# Patient Record
Sex: Female | Born: 1955 | Race: Black or African American | Smoking: Never smoker
Health system: Northeastern US, Academic
[De-identification: ages and names within clinical notes are randomized; demographics above are authoritative.]

## PROBLEM LIST (undated history)

## (undated) DIAGNOSIS — B1910 Unspecified viral hepatitis B without hepatic coma: Secondary | ICD-10-CM

## (undated) DIAGNOSIS — E785 Hyperlipidemia, unspecified: Secondary | ICD-10-CM

## (undated) HISTORY — DX: Unspecified viral hepatitis B without hepatic coma: B19.10

## (undated) HISTORY — DX: Hyperlipidemia, unspecified: E78.5

---

## 1978-01-16 DIAGNOSIS — K589 Irritable bowel syndrome without diarrhea: Secondary | ICD-10-CM | POA: Insufficient documentation

## 2005-08-04 DIAGNOSIS — F3289 Other specified depressive episodes: Secondary | ICD-10-CM | POA: Insufficient documentation

## 2005-08-04 DIAGNOSIS — B181 Chronic viral hepatitis B without delta-agent: Secondary | ICD-10-CM | POA: Insufficient documentation

## 2005-08-04 DIAGNOSIS — N943 Premenstrual tension syndrome: Secondary | ICD-10-CM | POA: Insufficient documentation

## 2005-08-04 DIAGNOSIS — N760 Acute vaginitis: Secondary | ICD-10-CM | POA: Insufficient documentation

## 2006-05-07 DIAGNOSIS — D259 Leiomyoma of uterus, unspecified: Secondary | ICD-10-CM | POA: Insufficient documentation

## 2010-04-17 ENCOUNTER — Encounter: Payer: Self-pay | Admitting: Gastroenterology

## 2011-03-28 ENCOUNTER — Other Ambulatory Visit: Payer: Self-pay | Admitting: Physical Medicine and Rehabilitation

## 2011-03-29 ENCOUNTER — Ambulatory Visit: Payer: Self-pay | Admitting: Physical Medicine and Rehabilitation

## 2011-03-29 ENCOUNTER — Encounter: Payer: Self-pay | Admitting: Physical Medicine and Rehabilitation

## 2011-03-29 VITALS — BP 138/65 | Ht 65.0 in | Wt 195.0 lb

## 2011-03-29 MED ORDER — MELOXICAM 15 MG PO TABS *I*
15.0000 mg | ORAL_TABLET | Freq: Every day | ORAL | Status: DC
Start: 2011-03-29 — End: 2016-07-17

## 2011-03-29 NOTE — Progress Notes (Signed)
Dear Dr. Jana Hakim, Pamela Senior, MD,PHD,     Pamela Mccoy was seen on 03/29/2011 at [facility] today to address the complaint of neck pain.        History of present illness: Pamela Mccoy is a 56 y.o. female with  acute onset of neck pain following a work related injury on February 24, 2011.  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  Patient's pain has improved.  She denies any radiation of pain into either upper extremity.  Pain is worse when she tries to move her neck.  Denies weakness or numbness.  Patient continues to work her regular job..     Past medical history, past surgical history, medications, allergies, social history, family history, and review of systems are per the questionnaire this date reviewed and annotated with the patient and scanned into the chart for reference.       Allergies:  Food    Current medications:   As per E. record    Past medical history:    Past Medical History   Diagnosis Date    Chronic pulmonary heart disease, unspecified      Conversion Data - ^Resolved    Hepatitis B     Depression         Past surgical history:   Past Surgical History   Procedure Date    Cesarean section, classic      Cesarean Section Conversion Data          Family history:  Family History   Problem Relation Age of Onset    Cancer Mother     Cancer Father          Social History:     The patient does not have a present or past history of suicide ideation or attempt.   Substance Abuse History: patient denied any past history of illicit substance use.   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  Positive for hep B  No fever/chills, CP, SOB and all other ROS negative on a 10 point review.    Physical exam:  Vitals: Blood pressure 138/65, height  1.651 m (5\' 5" ), weight 88.451 kg (195 lb).    General: pleasant  56 y.o., no significant pain behavior, seems comfortable, no distress, able to have conversation   HEENT: mucus moist, oral hygiene   Resp: breathing comfortably on room air   CV: Pulses regular in rate and rhythm  Abd: Soft, NT, ND   Ext's: no edema, no erythema/warmth  Skin: no rash or lesions observed on the skin   Musculoskeletal: No spasms or clonus present during the exam  Muscle strength is  5/5 of bilateral deltoids, biceps, triceps, wrist extensors, finger flexors, hip flexors, knee extensors, ankle dorsiflexors and EHL, and plantar flexors  Myofascial Exam: Tenderness to palpation in bilateral upper trapezius and paracervicals  Reflexes 2+ throughout  Neuro: No allodynia, AO x 3  No sensory deficits   Restriction in cervical range of motion and rotation to the right due to left-sided neck pain  Full shoulder range of motion    Previous imaging/diagnostic studies:   None available    Assessment:  Pamela Mccoy is a 56 y.o. female   with acute onset of neck  pain following a work related injury on February 24, 2011.  Symptoms and exam consistent with cervical myofascial pain.      Plan: Recommend physical therapy focusing on myofascial release.  Patient will be given a prescription for Mobic to take consistently for 2 weeks.  Followup in 4 weeks.        .      Thank you for allowing [place] to participate in the care of this patient. Please do not hesitate to call us with any questions.        Metro Kung, MD 03/29/2011 10:23 AM

## 2011-03-31 ENCOUNTER — Encounter: Payer: Self-pay | Admitting: Gastroenterology

## 2011-04-05 ENCOUNTER — Encounter: Payer: Self-pay | Admitting: Physical Medicine and Rehabilitation

## 2011-04-26 ENCOUNTER — Encounter: Payer: Self-pay | Admitting: Physical Medicine and Rehabilitation

## 2011-04-26 ENCOUNTER — Ambulatory Visit: Payer: Self-pay | Admitting: Physical Medicine and Rehabilitation

## 2011-04-26 VITALS — BP 136/67 | HR 80 | Ht 65.0 in | Wt 195.0 lb

## 2011-04-26 DIAGNOSIS — M542 Cervicalgia: Secondary | ICD-10-CM

## 2011-04-26 NOTE — Progress Notes (Signed)
Ms. Rutten is here today for a followup visit.  Overall she reports 75% improvement in neck pain.  She has been [facility] [place].    On exam there is some nonspecific tenderness to palpation in bilateral upper traps.  Cervical range of motion is functional.  There is no focal motor weakness.    Impression/plan: Cervicalgia status post work injury in February 2013.  Patient is making excellent progress with PT.  She is working regular job without restrictions.  Followup in 4-6 weeks.

## 2011-05-07 ENCOUNTER — Encounter: Payer: Self-pay | Admitting: Gastroenterology

## 2011-05-31 ENCOUNTER — Ambulatory Visit: Payer: Self-pay | Admitting: Physical Medicine and Rehabilitation

## 2011-07-26 ENCOUNTER — Encounter: Payer: Self-pay | Admitting: Sports Medicine

## 2011-07-26 ENCOUNTER — Ambulatory Visit: Payer: Self-pay | Admitting: Sports Medicine

## 2011-07-26 VITALS — BP 122/70 | Ht 65.0 in | Wt 193.0 lb

## 2011-07-26 DIAGNOSIS — M25529 Pain in unspecified elbow: Secondary | ICD-10-CM

## 2011-07-26 NOTE — Progress Notes (Signed)
History: Pamela Mccoy is a 56 y.o. that is being seen as a consult request from Dr. Lavonne Chick, MD,PHD for evaluation of Right elbow pain. The symptoms first occured 4 weeks ago.  The location of the pain is lateral.  Aggravating factors include:movement, lifting.  Alleviating factors include:  rest.     Past medical history, past surgical history, medications, allergies, family history, social history, and review of systems were reviewed today and have been documented separately in this encounter.      Physical Examination:  She is in no acute distress.  She is alert and oriented x 3.     Examination of the right elbow:  The elbow extends to 0.  She flexes to 150.  She  Has full pronation and supination.  She is tender to palpation over the lateral epicondyle.  She is not tender over the medial epicondyle.   She is not tender over the olecranon.  She is not tender at the radial head.  She is not tender over the distal biceps tendon.  Her elbow is stable to varus stress.  Her elbow is stable to valgus stress.  Milking maneuver was not done.  She has grade 4/5 wrist flexion. She has grade 4/5 wrist extension.  Resisted wrist extension causes mild pain.  Resisted wrist flexion cause no pain.  Resisted supination causes no pain.  Resisted pronation causes no pain.  Distally  she is neurovascularly intact.     Imaging: I personally reviewed the patients images. Xray's demonstrate no fracture, dislocation, or loose body.    Assessment:  Right elbow lateral epicondylitis.    Plan: We discussed the diagnosis and treatment.  The patient would like to proceed with a course of physical therapy and NSAIDS.  We will see them back in 6 weeks to evaluate  However we would be happy to see them sooner should symptoms warrant. We will likely do corticosteroid injection at the next visit if her symptoms have not resolved.    Jeb Levering PA performed the duties of a scribe for this encounter in the presence of the documenting  physician. This patient was seen by Dr. Normajean Glasgow who personally examined the patient and determined the plan of care.

## 2011-08-01 ENCOUNTER — Ambulatory Visit: Payer: Self-pay

## 2011-08-01 DIAGNOSIS — M7711 Lateral epicondylitis, right elbow: Secondary | ICD-10-CM

## 2011-08-01 NOTE — Progress Notes (Signed)
[facility]    History  Diagnosis: Right elbow lateral epicondylitis      Onset date:  Approximately 5 weeks ago, Acute  Date of surgery: NA    Subjective    Pamela Mccoy is a 56 y.o. female who is present today for right, elbow pain.  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  Stresses/physical demands of job: Office work with some patient care  Stresses/physical demands of home: Housekeeping, Gardening/Yard Work and hiking, working out at [employer]): None  Diagnostic tests: X-ray   Other: NA    Symptom location: Lateral, right  Relevant symptoms: Aching, Sharp, Pain , Decreased strength  Symptom frequency: Intermittent  Symptom intensity (0 - 10 scale): Now 4 Best 1 Worst 8   Night Pain: no    Restful sleep:   yes  Morning Pain/Stiffness: N/A   Symptoms worsen with: Lifting, Gripping  Symptoms improve with: Rest, Medication  Assistive device:  none  Patient's goals for therapy: Reduce pain, Increase ROM, Increase strength, Independent with home program     Objective:    Observation: WNL  Palpation: tenderness @ joint  Cervical Screen:  Not Tested  Neurologic:  Not Tested    Incision:  NA    ROM/Strength    UE AROM AROM PROM PROM MMT MMT    Right Left Right Left Right Left   Elbow         Flexion 150        Extension 0        Pronation         Supination                     UE AROM AROM PROM PROM MMT MMT    Right Left Right Left Right Left   Wrist         Flexion         Extension           Special Tests:    Shoulder NA   Elbow Milking Maneuver,  negative, Moving Valgus stress test,  negative, Lateral / Medial Epicondylitis,  positive   Wrist/hand NA     Functional:  Perform self-care activities/basic ADLs - able to perform.    Assessment:   Findings consistent with 56 y.o., female with Right Lateral Epicondylitis  with pain, strength  limitations, functional limitations    Prognosis:  Good   Contraindications/Precautions/Limitation:  Per diagnosis    Short Term Goals: (3 week(s)): Decrease pain to 2/10  and Increase strength by 50%  Long Term Goals: (6 week(s)): Pain/Sx 0 - minimal, ROM/ flexibility WNL , Restoration of functional strength, Independent with HEP/education , Functional return to ADLs / activites without limitation     Treatment Plan:   Options / plan reviewed with patient:  yes  Freq 1 times per week for 6 week(s)    Treatment plan inclusive of:   Exercise: AROM, Stretching, Progressive Resistive   Manual Techniques:  N/A   Modalities:  Cryotherapy, Iontophoresis with dexmethoasone sodium phosphate 4mg /ml   Functional: Proprioception/Dynamic stability, Functional rehab, Eccentric exercises    Thank you for the referral of this patient to [facility].    Francene Finders, M.S., ATC, PTA    Exercise Flow Sheet  Wrist Flex Stretch HEP  Wrist Ext Stretch HEP   4-Wrist T-Band Program (Flex, Ext, UD, RD) HEP   Hammer Sup/Pron. HEP   Flex Bar Eccentrics HEP   Eccentric Ext. with DB HEP   Bicep Curls HEP   Wrist ext HEP           Iontophoresis Dex. 4 mg/mL - Hybresis Patch

## 2011-08-02 ENCOUNTER — Telehealth: Payer: Self-pay

## 2011-08-02 DIAGNOSIS — M7710 Lateral epicondylitis, unspecified elbow: Secondary | ICD-10-CM

## 2011-08-02 NOTE — Telephone Encounter (Signed)
Request order signature for [facility].   Please click "Sign" to complete.   We appreciate your prompt attention to this request. Please do not hesitate to contact us with any questions.  [facility]

## 2011-09-05 ENCOUNTER — Ambulatory Visit: Payer: Self-pay | Admitting: Sports Medicine

## 2011-12-07 DIAGNOSIS — M7710 Lateral epicondylitis, unspecified elbow: Secondary | ICD-10-CM

## 2011-12-07 NOTE — Progress Notes (Signed)
[facility]  Discharge Summary      Pamela Mccoy  [id number]  12/07/2011    Diagnosis: Right Elbow Lateral Epicondylitis    SUMMARY OF TREATMENTS:  Received care for 1 rehabilitation visits.    Attendance:  Fair    Compliance:  Fair     The treatment(s) included:  Home program instruction, Therapeutic exercise (ROM/flexibility/strength), Iontophoresis    Treatment Goals:  Not Achieved  Range of Motion/Flexibility:  Unknown  Strength/Motor Performance:  Unknown  Functional Recovery:  Unknown  Pain Control:  Unknown  Home Program:  Achieved  Other:  N/A    REASON FOR DISCHARGE:  Patient did not return for follow-up     Prognosis at time of discharge:  fair  Comments:  Patient was asked to attend PT for exercise progression and 6 treatments of Iontophoresis. Patient came for initial evaluation only and did not return for follow-up as suggested.    Discharge planning:  Discussion regarding the maintenance of appropriate exercise and activity as part of a healthy lifestyle.  Optional utilization of Post-Rehab Conditioning Program [facility]) for transition and/or additional exercise/activity support    Thank you for the referral of this patient to [facility].    Francene Finders, M.S., ATC, PTA

## 2013-01-06 ENCOUNTER — Encounter: Payer: Self-pay | Admitting: Dermatology

## 2013-01-06 ENCOUNTER — Ambulatory Visit: Payer: Self-pay | Admitting: Dermatology

## 2013-01-06 VITALS — Ht 65.0 in | Wt 200.0 lb

## 2013-01-06 MED ORDER — CLOBETASOL PROPIONATE 0.05 % EX SOLN *I*
Freq: Two times a day (BID) | CUTANEOUS | Status: DC
Start: 2013-01-06 — End: 2016-07-17

## 2013-01-06 MED ORDER — KETOCONAZOLE 2 % EX SHAM *I*
MEDICATED_SHAMPOO | CUTANEOUS | Status: DC
Start: 2013-01-06 — End: 2016-07-17

## 2013-01-06 NOTE — Patient Instructions (Addendum)
1. Likely reaction to hair dye, with dermatitis in the scalp   --Clobetasol solution twice daily to the scalp for 1 month   --Ketoconazole to wash hair every other week, then wash with your preferred shampoo   --Consider patch testing       Follow up as needed

## 2013-01-06 NOTE — Progress Notes (Addendum)
Consulting MD: Teofilo Pod, MD    Chief Complaint   Patient presents with   . New Patient Visit         HPI: Patient is a 57 y.o. female who presents for evaluation and treatment of allergic reaction on her head    This occurred about a week ago.  Pamela Mccoy reports that she dyed her hair on Saturday, and then broke out on Tuesday.  She also used a conditioner at the [employer] that was new the same day.  She went into the sauna on that Tuesday morning while on a cruise to [place] (started the cruise on Monday).  Symptoms were facial swelling, and rash on the hairline and scalp.  The rash was itchy, and burned.  She denies any other rash or burning.  She saw a doctor on the cruise, who started her on steroids, benadryl, and cetirizine.  She was also given eye drops to prevent infection.  She is now done with the steroid course (medrol dose pack).  Overall she feels better, just feeling that her scalp is very itchy and burning.    Patient does not regularly wear sunscreen. There is not a family history of melanoma or nonmelanoma skin cancers.        ROS: Patient is otherwise in normal state of health. No other skin concerns.     Allergies:  Allergies   Allergen Reactions   . Honey Anaphylaxis   . Mango Anaphylaxis   . Sunflower Oil Anaphylaxis       Current Medications:  Patient's Medications   New Prescriptions    CLOBETASOL (TEMOVATE) 0.05 % EXTERNAL SOLUTION    Apply topically 2 times daily    KETOCONAZOLE (NIZORAL) 2 % SHAMPOO    Apply topically twice a week   Previous Medications    ASPIRIN    By no specified route as needed    IBUPROFEN (ADVIL,MOTRIN) 200 MG TABLET    Take 200 mg by mouth as needed    LORATADINE (CLARITIN) 10 MG TABLET    Take 10 mg by mouth daily    MELOXICAM (MOBIC) 15 MG TABLET    Take 1 tablet (15 mg total) by mouth daily (with breakfast)   Take with food.   Modified Medications    No medications on file   Discontinued Medications    No medications on file         PMH:   Active  Ambulatory Problems     Diagnosis Date Noted   . Chronic Hepatitis, B Virus 08/04/2005   . Depression 08/04/2005   . Irritable Bowel Syndrome 01/16/1978   . Bacterial Vaginosis 08/04/2005   . Premenstrual Syndromes 08/04/2005   . Leiomyoma Of The Uterus 05/07/2006   . Dermatitis 01/06/2013     Resolved Ambulatory Problems     Diagnosis Date Noted   . No Resolved Ambulatory Problems     Past Medical History   Diagnosis Date   . Chronic pulmonary heart disease, unspecified    . Hepatitis B    . Depression          Past Surgical History   Procedure Laterality Date   . Cesarean section, classic       x 1   . Cesarean section, classic       Cesarean Section Conversion Data          FH:  Family History   Problem Relation Age of Onset   . Cancer Mother    .  Cancer Father          SocH:  History     Social History   . Marital Status: Single     Spouse Name: N/A     Number of Children: N/A   . Years of Education: N/A     Occupational History   . Not on file.     Social History Main Topics   . Smoking status: Never Smoker    . Smokeless tobacco: Not on file   . Alcohol Use: Yes      Comment: 1/week   . Drug Use: No   . Sexual Activity: Not on file     Other Topics Concern   . Not on file     Social History Narrative    ** Merged History Encounter **              PE  Filed Vitals:    01/06/13 1033   Height: 1.651 m (5\' 5" )   Weight: 90.719 kg (200 lb)     General: Awake and alert   All of the following were examined and found to be within normal limits except as noted below:  -Face/Neck/Scalp: There are scaly papules along the hairline and in the scalp, with diffuse scale.  --No facial edema   -BUE/hands:    Barriers to learning: None.    Assessment/Plan:  All diagnoses and treatment plans listed below were discussed thoroughly with the patient.     1. Likely reaction to hair dye (henna--PPD), with dermatitis in the scalp   --Clobetasol solution twice daily to the scalp for 1 month   --Ketoconazole to wash hair every other  week, then wash with your preferred shampoo   --Consider patch testing       Follow up as needed         Pollie Meyer, MD  Family Medicine Resident  01/06/2013 11:54 AM  Page:  [phone]    I saw and evaluated the patient. I have reviewed and edited the resident's/fellow's note and confirm the findings and plan of care as documented above.    Serina Cowper, MD

## 2013-01-14 ENCOUNTER — Telehealth: Payer: Self-pay

## 2013-01-14 NOTE — Telephone Encounter (Signed)
Left patient a message regarding patch testing.

## 2013-03-31 LAB — HEPATITIS C ANTIBODY: Hep C Ab: NEGATIVE

## 2014-05-25 ENCOUNTER — Encounter: Payer: Self-pay | Admitting: Gastroenterology

## 2014-05-25 LAB — HEMOGLOBIN A1C: Hemoglobin A1C: 6 %

## 2014-11-17 ENCOUNTER — Other Ambulatory Visit: Payer: Self-pay | Admitting: Gastroenterology

## 2014-11-17 LAB — HM MAMMOGRAPHY

## 2015-04-27 ENCOUNTER — Encounter: Payer: Self-pay | Admitting: Gastroenterology

## 2015-04-27 LAB — LIPID PANEL
Chol/HDL Ratio: 4.3
Cholesterol: 189 mg/dL
HDL: 44 mg/dL
LDL Calculated: 132 mg/dL — ABNORMAL HIGH
Non HDL Cholesterol: 145 mg/dL
Triglycerides: 65 mg/dL

## 2016-02-23 ENCOUNTER — Telehealth: Payer: Self-pay | Admitting: Primary Care

## 2016-02-23 NOTE — Telephone Encounter (Signed)
Tdap was done on 06/04/14, she is up to date.

## 2016-02-23 NOTE — Telephone Encounter (Signed)
Pt given message below

## 2016-02-23 NOTE — Telephone Encounter (Signed)
Patient calling as she just cut her hand on a metal strip and is wondering when she had her last tetanus, thinks it was with her physical last year.  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

## 2016-02-23 NOTE — Telephone Encounter (Signed)
Attempted to call pt. No answer, mailbox full, unable to leave a message . Will retry to call later.

## 2016-03-06 ENCOUNTER — Encounter: Payer: Self-pay | Admitting: Primary Care

## 2016-07-17 ENCOUNTER — Encounter: Payer: Self-pay | Admitting: Primary Care

## 2016-07-17 ENCOUNTER — Ambulatory Visit: Payer: PRIVATE HEALTH INSURANCE | Attending: Primary Care | Admitting: Primary Care

## 2016-07-17 ENCOUNTER — Encounter: Payer: Self-pay | Admitting: Gastroenterology

## 2016-07-17 VITALS — BP 120/64 | HR 70 | Wt 212.0 lb

## 2016-07-17 DIAGNOSIS — Z0000 Encounter for general adult medical examination without abnormal findings: Secondary | ICD-10-CM

## 2016-07-17 DIAGNOSIS — Z1211 Encounter for screening for malignant neoplasm of colon: Secondary | ICD-10-CM

## 2016-07-17 DIAGNOSIS — B181 Chronic viral hepatitis B without delta-agent: Secondary | ICD-10-CM

## 2016-07-17 LAB — PCMH DEPRESSION ASSESSMENT

## 2016-07-17 NOTE — Progress Notes (Signed)
Subjective:      Pamela Mccoy is a 61 y.o. female and is here for a comprehensive physical exam. The patient reports no problems. 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 Needs proof of MMR, titers done in 2017.  History of positive Hepatitis B test, viral load negative in 2016. Asymptomatic  History of elevated lipids, she did lose weight, is exercising.        Activities of Daily Living   In your present state of health, do you have any difficulty performing the following activities?:   Preparing food and eating?: No  Bathing yourself: No  Getting dressed: No  Using the toilet:No  Moving around from place to place: No  In the past year have you fallen or had a near fall?:No   Exercise regularly: Yes  Use seatbelts: Yes  Smoke detector in house: Yes    Past Medical History:   Diagnosis Date    Hepatitis B     Hyperlipidemia      Past Surgical History:   Procedure Laterality Date    CESAREAN SECTION, CLASSIC      x 1    CESAREAN SECTION, CLASSIC      Cesarean Section Conversion Data      Family History   Problem Relation Age of Onset    Cancer Mother     Cancer Father     Breast cancer Maternal Aunt     reports that she has never smoked. She has never used smokeless tobacco. She reports that she drinks alcohol. She reports that she does not use illicit drugs.    No current outpatient prescriptions on file.     No current facility-administered medications for this visit.        Allergies   Allergen Reactions    Honey Anaphylaxis    Mango Anaphylaxis    Sunflower Oil Anaphylaxis       Review of Systems  Do you have pain that bothers you in your daily life? no  Constitutional: negative for chills and fevers  Eyes: negative for redness and visual disturbance  Respiratory: negative for cough and hemoptysis  Cardiovascular: negative for chest pressure/discomfort  Gastrointestinal: negative for abdominal pain and  melena  Genitourinary:negative for frequency and hematuria  Integument/breast: negative for breast lump and breast tenderness  Hematologic/lymphatic: negative for bleeding and easy bruising  Musculoskeletal:negative for bone pain  Neurological: negative for dizziness  Endocrine: negative for diabetic symptoms including polydipsia, polyphagia and polyuria     Objective:     Vitals:    07/17/16 0815   BP: 120/64   Pulse: 70   Weight: 96.2 kg (212 lb)      BP 120/64 (BP Location: Left arm, Patient Position: Sitting, Cuff Size: large adult)   Pulse 70   Wt 96.2 kg (212 lb)   BMI 35.28 kg/m2  General appearance: alert and no distress  Eyes: conjunctivae/corneas clear. PERRL, EOM's intact. Fundi benign.  Ears: normal TM's and external ear canals both ears  Throat: lips, mucosa, and tongue normal; teeth and gums normal  Neck: no adenopathy, no carotid bruit, no JVD, supple, symmetrical, trachea midline and thyroid not enlarged, symmetric, no tenderness/mass/nodules  Back: range of motion normal  Lungs: clear to auscultation bilaterally  Breasts: normal appearance, no masses or tenderness  Heart: regular rate and rhythm, S1, S2 normal, no murmur, click, rub or gallop  Abdomen: soft, non-tender; bowel sounds normal; no masses,  no organomegaly  Extremities: extremities normal, atraumatic, no cyanosis or edema  Pulses: 2+ and symmetric  Skin: Skin color, texture, turgor normal. No rashes or lesions  Lymph nodes: Cervical, supraclavicular, and axillary nodes normal.  Neurologic: Grossly normal  Psych: Normal mood and affect        Assessment/Plan:    1. Routine physical examination  Health Maintenance Discussed.  Immunizations: up to date with Tdap.   Advised to maintain a healthy balanced diet.and regular cardiovascular exercise program.  Dental care discussed.  Motor vehicle safety discussed.  Skin cancer awareness and prevention discussed.  STD risk, counseling and prevention discussed.   HIV test offered. Patient declined.    Routine lab work ordered.  Advance directives/Health care proxy discussed.   Colonoscopy is overdue, referral done  Advised to schedule mammogram and Pap smear.   Paperwork for [facility] completed.  - CBC and differential; Future  - Comprehensive metabolic panel; Future  - Lipid add Rfx to Drt LDL if Trig >400; Future    2. Chronic viral hepatitis B  Will check liver function tests, viral load  - Hepatitis B DNA, ultraquantitative, PCR; Future    3. Colon cancer screening  Referral for Colonoscopy  - AMB REFERRAL TO GASTROENTEROLOGY      Brandilyn Nanninga Dianna Rossetti, MD

## 2016-07-17 NOTE — Patient Instructions (Addendum)
Advised to maintain a healthy balanced diet.   Regular exercise is recommended.  Advised to continue routine medications.  Routine labs ordered today.  A Colonosocpy is recommended starting at the age of 76, earlier if there is a significant family history of colon cancer, or if you are experiencing certain symptoms.    Advanced Directives reviewed.   Regular mammograms and Pap Smears recommended.   Regular dental exams are recommended.

## 2016-07-18 ENCOUNTER — Telehealth: Payer: Self-pay | Admitting: Primary Care

## 2016-07-18 NOTE — Telephone Encounter (Signed)
Pt states she already has a copy of completed form . 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

## 2017-01-18 ENCOUNTER — Telehealth: Payer: Self-pay | Admitting: Primary Care

## 2017-01-18 NOTE — Telephone Encounter (Signed)
Lmtcb: overdue for mammogram

## 2017-03-07 ENCOUNTER — Telehealth: Payer: Self-pay | Admitting: Primary Care

## 2017-03-07 MED ORDER — PERMETHRIN 5 % EX CREA *I*
TOPICAL_CREAM | Freq: Once | CUTANEOUS | 0 refills | Status: AC
Start: 2017-03-07 — End: 2017-03-07

## 2017-03-07 NOTE — Telephone Encounter (Signed)
Advised pt-    Prescription sent to pharmacy     Pt verbalized understanding

## 2017-03-07 NOTE — Telephone Encounter (Signed)
Prescription sent to pharmacy.

## 2017-03-07 NOTE — Telephone Encounter (Signed)
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  She was advised by the group home to call and request the medicated soap to use as prevention.  Please advise    [facility] [place]- 07/17/16

## 2017-07-10 ENCOUNTER — Telehealth: Payer: Self-pay | Admitting: Primary Care

## 2017-07-10 NOTE — Telephone Encounter (Signed)
Left message reminding patient she is overdue for mammogram & labs, also colonoscopy due in July. Please call the office if these have been scheduled or done.

## 2017-12-10 ENCOUNTER — Telehealth: Payer: Self-pay | Admitting: Primary Care

## 2017-12-10 NOTE — Telephone Encounter (Signed)
[facility] apt scheduled

## 2017-12-10 NOTE — Telephone Encounter (Signed)
Patient called asking for C-diff results of testing she had done at the emergency department in [place] on 11/28/17, Do not see results on chart. Called their hospital and spoke to medical records who asked for a cover sheet to be faxed to them requesting this information. Faxed to [phone]    Patient is asking to  Be called back

## 2017-12-10 NOTE — Telephone Encounter (Signed)
I recommend a hospital follow up visit, it could be 20 minutes, she has not been seen since 2018

## 2017-12-17 ENCOUNTER — Ambulatory Visit
Admission: RE | Admit: 2017-12-17 | Discharge: 2017-12-17 | Disposition: A | Discharge status: Home / Self Care | Payer: PRIVATE HEALTH INSURANCE | Source: Ambulatory Visit | Attending: Primary Care | Admitting: Primary Care

## 2017-12-17 ENCOUNTER — Ambulatory Visit: Payer: PRIVATE HEALTH INSURANCE | Attending: Primary Care | Admitting: Primary Care

## 2017-12-17 VITALS — BP 126/80 | HR 84 | Temp 98.5°F | Ht 65.0 in | Wt 212.0 lb

## 2017-12-17 DIAGNOSIS — M25551 Pain in right hip: Secondary | ICD-10-CM | POA: Insufficient documentation

## 2017-12-17 DIAGNOSIS — R300 Dysuria: Secondary | ICD-10-CM

## 2017-12-17 DIAGNOSIS — R197 Diarrhea, unspecified: Secondary | ICD-10-CM | POA: Insufficient documentation

## 2017-12-17 DIAGNOSIS — Z1211 Encounter for screening for malignant neoplasm of colon: Secondary | ICD-10-CM | POA: Insufficient documentation

## 2017-12-17 LAB — POCT URINALYSIS DIPSTICK
Bilirubin,Ur: NEGATIVE
Blood,UA POCT: NEGATIVE
Glucose,UA POCT: NORMAL mg/dL
Ketones,UA POCT: NEGATIVE mg/dL
Leuk Esterase,UA POCT: 2 — AB
Lot #: 36253602
Nitrite,UA POCT: NEGATIVE
PH,UA POCT: 5 (ref 5–8)
Protein,UA POCT: NEGATIVE mg/dL
Specific gravity,UA POCT: 1.02 (ref 1.002–1.030)
Urobilinogen,UA: NORMAL mg/dL

## 2017-12-17 NOTE — Patient Instructions (Addendum)
You may have a Urinary tract or vagina infection, will send urine sample for further evaluation.   Recommend increased fluids.   Establish care with a Gynecologist, can try [facility]: [phone] or [facility] at [phone].  Schedule mammogram at [facility] : [phone].  Schedule colonoscopy  Get hip xray as directed.

## 2017-12-17 NOTE — Progress Notes (Signed)
HPI: Pamela Mccoy is a 62 y.o. female who is here for ER follow up:    Was seen at [facility] ER 11/28/17 for diarrhea. Symptoms started suddenly while visiting family in [place]. Does not recall eating any new foods, eating at restaurants etc. Did not have any URI symptoms prior. 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 Had not been on antibiotics prior to symptoms.   In ER, lab work done were unremarkable and she was treated with Zofran, Loperamide and prescribed probiotics. Stool sample done in ER eventually revealed Astrovirus, but was negative for C. diff, norovirus, Campylobacter Salmonella E. Coli. Her symptoms resolved after a day. She is here for follow-up.  Has had no further diarrhea. Only medicine is probiotic.    Patient also complains of burning with urination, clear vaginal discharge. Thinks symptoms started after above diarrhea episode. No flank pain, hematuria. No pap smear in years. No recent fever, chills, night sweats    Complains of right hip pain, ongoing for months. Dull ache, notices it more when she has been sitting for period of time, then gets up to ambulate. Last a few minutes, then she stretches and it goes away. No injury. No back pain.         PMH: History reviewed with patient. Details include   Past Medical History:   Diagnosis Date    Hepatitis B     Hyperlipidemia      Past Surgical History:   Procedure Laterality Date    CESAREAN SECTION, CLASSIC      x 1    CESAREAN SECTION, CLASSIC      Cesarean Section Conversion Data        Family history:  Family History   Problem Relation Age of Onset    Cancer Mother     Cancer Father     Breast cancer Maternal Aunt          Social history:  Social History     Tobacco Use    Smoking status: Never Smoker    Smokeless tobacco: Never Used   Substance Use Topics    Alcohol use: Yes     Comment: 1/week       Meds:  Reviewed with patient.  Outpatient Encounter Medications  as of 12/17/2017   Medication Sig Dispense Refill    Lactobacillus (PROBIOTIC ACIDOPHILUS) CAPS Take 1 tablet by mouth      triamcinolone (KENALOG) 0.1 % cream Apply to affected area three times daily for 7 days.  Avoid face/groin/axilla      [DISCONTINUED] ondansetron (ZOFRAN-ODT) 4 MG disintegrating tablet Take 4 mg by mouth      loperamide (IMODIUM) 2 MG capsule TK ONE C PO  QID PRN FOR DIARRHEA      ondansetron (ZOFRAN-ODT) 4 MG disintegrating tablet DIS ONE T PO  Q 8 H PRN       No facility-administered encounter medications on file as of 12/17/2017.        Festus Aloe had no medications administered during this visit.  Medication list was reviewed and reconciled, and any changes in medications made during today's visit have been discussed with the patient.    Allergies:   Allergies   Allergen Reactions    Honey Anaphylaxis    Mango Anaphylaxis    Sunflower Oil Anaphylaxis         Review of Systems:   General ROS: negative for - chills or fever  Respiratory ROS: no cough, shortness of breath, or wheezing  Cardiovascular ROS: no chest pain or dyspnea on exertion  Gastrointestinal ROS: as above  Psychological ROS: negative for - anxiety    Physical Exam:  Vitals:   Vitals:    12/17/17 1312   BP: 126/80   Pulse: 84   Temp: 36.9 C (98.5 F)   TempSrc: Temporal   Weight: 96.2 kg (212 lb)   Height: 1.651 m (5\' 5" )                                 Body mass index is 35.28 kg/m.  BP Readings from Last 4 Encounters:   12/17/17 126/80   07/17/16 120/64   07/26/11 122/70   04/26/11 136/67      Wt Readings from Last 4 Encounters:   12/17/17 96.2 kg (212 lb)   07/17/16 96.2 kg (212 lb)   01/06/13 90.7 kg (200 lb)   07/26/11 87.5 kg (193 lb)         General- In NAD  HEENT-  Eyes anicteric. Neck-supple.  No thyromegaly or lymphadenopathy.   Cardiovascular- rate and rhythm regular, normal S1, S2, no murmur. Carotid pulses 2+, no bruits.   Respiratory- non labored breathing, effort normal, clear to auscultation  bilaterally  GI- abdomen soft, nontender, nondistended, no hepatosplenomegaly or masses  Extr:  No C/C/E. Distal pulses 2+, capillary refill less than 2 seconds.    Musk- no spinal tenderness. No palpable tenderness along right hip. FROM at hip, gait steady  Psych- normal mood and affect    Assessment/Plan:     1. Diarrhea, unspecified type  Has resolved.    2. Dysuria  Urine dip with WBC's, but she also has vaginal discharge. UTI vs vaginitis, eg Bacterial vaginosis possible.   Will check urine culture, and treat accordingly.   Recommend that she schedule appointment with GYN as well, names/contacts of 2 GYN groups given to patient.   - POCT Urinalysis Dipstick  - Aerobic culture (urine-voided); Future  - Aerobic culture (urine-voided)    3. Colon cancer screening  Due for colonoscopy, referred last year, GI office made multiple attempts to reach her but she never set up appointment.   New referral done to GI, advised to call for appointment.   - AMB REFERRAL TO GASTROENTEROLOGY    4. Right hip pain  Suspect arthritis. Will obtain xray  - * Hip RIGHT AP and Lateral views; Future    Health maintenance: Advised patient to schedule mammogram at [place] [place] [facility] where she typically goes.        Miasia Crabtree Dianna Rossetti, MD

## 2017-12-18 LAB — AEROBIC CULTURE: Aerobic Culture: 0

## 2017-12-19 ENCOUNTER — Telehealth: Payer: Self-pay | Admitting: Primary Care

## 2017-12-19 NOTE — Telephone Encounter (Signed)
Called patient. No answer. Voicemail full.

## 2017-12-19 NOTE — Telephone Encounter (Signed)
Please inform patient that urine culture was negative for infection, she should follow up with Gynecologist for symptoms

## 2017-12-20 ENCOUNTER — Telehealth: Payer: Self-pay | Admitting: Primary Care

## 2017-12-20 DIAGNOSIS — N898 Other specified noninflammatory disorders of vagina: Secondary | ICD-10-CM

## 2017-12-20 NOTE — Telephone Encounter (Addendum)
Patient called looking for results of her hip x ray done on Monday.     I gave her the message regarding her urine cx results.  She will go see her gyn.    She called back again, she can't get into a gyn til Feb, can you refer her to someone within UR.  Who do you recommend.   She is ok to wait til tomorrow. I did give her [facility] to try.

## 2017-12-20 NOTE — Telephone Encounter (Signed)
Inform patient that xray revealed mild arthritis.     Patient was given number for [facility]YN practice at appointment, it is on her AVS as well. I don't believe she needs referral but I did process one

## 2017-12-20 NOTE — Telephone Encounter (Signed)
Spoke to [employer], told her the message below, all set. Please inform patient that urine culture was negative for infection, she should follow up with Gynecologist for symptoms

## 2017-12-20 NOTE — Telephone Encounter (Signed)
Pt advised  Inform patient that xray revealed mild arthritis.       Patient verbalized understanding, ask if there is anything she needs to do in regards to these xray results.   She also stated she called OB-GYN phone number listed in her AVS but they are not scheduling until January for February appointments and she needs to be seen sooner than that. She will call around and let us know if she has not had any luck for sooner apt.

## 2018-01-02 ENCOUNTER — Encounter: Payer: Self-pay | Admitting: Gastroenterology

## 2018-01-06 ENCOUNTER — Other Ambulatory Visit: Payer: Self-pay | Admitting: Urgent Care

## 2018-01-07 LAB — VAGINITIS SCREEN: DNA PROBE
Candida Species: NEGATIVE
Gardnerella vaginalis: NEGATIVE
Trichomonas vaginosis: NEGATIVE

## 2018-01-15 ENCOUNTER — Telehealth: Payer: Self-pay | Admitting: Primary Care

## 2018-01-15 DIAGNOSIS — Z1239 Encounter for other screening for malignant neoplasm of breast: Secondary | ICD-10-CM

## 2018-01-15 NOTE — Telephone Encounter (Signed)
Reminder letter regarding mammogram, colonoscopy to be mailed to patient.

## 2018-03-12 ENCOUNTER — Telehealth: Payer: Self-pay | Admitting: Primary Care

## 2018-03-12 NOTE — Telephone Encounter (Signed)
Left message reminding patient she is overdue for colonoscopy ([facility]) and to return the paperwork.  Also reminded patient to scheduled mammogram at [facility].

## 2018-03-12 NOTE — Telephone Encounter (Signed)
-----   Message from Leonard Downing sent at 12/12/2017  9:37 AM EST -----  Regarding: Gaps for Mammo & Colon  OV with BD 12/17/17 - Did BD address mammo & colon?  Referral? Cologuard?

## 2018-04-01 ENCOUNTER — Telehealth: Payer: Self-pay | Admitting: Primary Care

## 2018-04-01 NOTE — Telephone Encounter (Signed)
Pt notified -She should remain at for the time being. Practice basic viral precautions, as if she had the flu or cold. Hand hygiene. 6 feet of separation.  Let us know test results of roommate, but call if she develops fever, chills, shortness of breath

## 2018-04-01 NOTE — Telephone Encounter (Signed)
She should remain at for the time being. Practice basic viral precautions, as if she had the flu or cold.  Hand hygiene. 6 feet of separation.  Let us know test results of roommate, but call if she develops fever, chills, shortness of breath

## 2018-04-01 NOTE — Telephone Encounter (Signed)
Patient asking for advice. Had presumed flu a few weeks ago, cough persists. states she feels pretty good despite cough. Roommate also has cough & was struggling to breathe. Was taken to [facility] this morning  to be swabbed for influenza & corona virus. Does not have results yet. She is asking if she should be tested as well.

## 2018-04-01 NOTE — Telephone Encounter (Signed)
Left message for patient to call our office.

## 2018-04-02 NOTE — Telephone Encounter (Signed)
Patient called me today, she states it will be another 24 hours til her house mate gets the COVID results.

## 2018-04-02 NOTE — Telephone Encounter (Signed)
Noted  

## 2018-04-03 ENCOUNTER — Encounter: Payer: Self-pay | Admitting: Primary Care

## 2018-04-03 NOTE — Telephone Encounter (Addendum)
As patient remains fairly asymptomatic, and cough has improved, I recommend still remaining at home until results of roommate's COVID 19 test is known, practice self isolation.   Work note written.   If she becomes symptomatic, with fever, shortness of breath, need to inform office.

## 2018-04-03 NOTE — Telephone Encounter (Signed)
Patient is wondering what to do.  Housemate's testing is still pending - will be days for the result.  He is not improving. They have a call in to his physician.  Patient is due to return to work tonight at 10 pm.  Her cough has improved, no fever.

## 2018-04-03 NOTE — Telephone Encounter (Signed)
Relayed information to patient who states understanding. Sent text with code to activate my chart as written permission is needed to fax work letter. She will provide employer's fax number.

## 2018-04-08 NOTE — Telephone Encounter (Signed)
Letter faxed to [phone]

## 2018-04-08 NOTE — Telephone Encounter (Signed)
Letter re-done.

## 2018-04-08 NOTE — Telephone Encounter (Signed)
Pt called requesting RTW note be dated today and needs to say May return to work today on full duty. Fax to [phone].

## 2018-07-23 ENCOUNTER — Other Ambulatory Visit: Payer: Self-pay | Admitting: Gastroenterology

## 2018-12-02 ENCOUNTER — Other Ambulatory Visit: Payer: Self-pay | Admitting: Urgent Care

## 2018-12-02 ENCOUNTER — Telehealth: Payer: Self-pay | Admitting: Primary Care

## 2018-12-02 NOTE — Telephone Encounter (Signed)
Asymptomatic patients can be tested at various places, see list below.     [facility]  [phone]  Appointment required    [place]  [place]  [phone]    [facility]  [phone]

## 2018-12-02 NOTE — Telephone Encounter (Signed)
COVID EXPOSURE:    1- Relationship to person you were exposed to  :       Brother  2- Date(s) of exposure :         11/ 9th and 10th  3- How long were you exposed (time) :        Stayed at her house  4- Did anyone wear mask :          no  5- Were you indoors or outdoors :        both  6- Do you have symptoms :         no  7- What are symptoms :       0  8- When did exposed person come down with symptoms :      11/12th  9- When did they test positive :           11/13th      The following information is needed to order testing.     Home address  :     [address]  [place] [address]   Phone number  :     [phone]   Occupation :           Group home, direct support   Employer name :    [employer], [place]   Work address :       [place]   Employer phone number:   [phone]   Whether you work or Psychologist, occupational in an elementary, secondary, or post-secondary school, and the name of such school:      na   Whether you attend school (if applicable):     na

## 2018-12-02 NOTE — Telephone Encounter (Signed)
Left message that testing information will be sent to my chart & to call with any questions.

## 2018-12-03 LAB — COVID-19 NAAT (PCR): COVID-19 NAAT (PCR): NOT DETECTED

## 2019-02-21 ENCOUNTER — Other Ambulatory Visit: Payer: Self-pay

## 2019-02-24 ENCOUNTER — Encounter: Payer: Self-pay | Admitting: Primary Care

## 2019-02-24 ENCOUNTER — Ambulatory Visit: Payer: PRIVATE HEALTH INSURANCE | Admitting: Primary Care

## 2019-02-24 VITALS — BP 118/78 | HR 68 | Wt 205.0 lb

## 2019-02-24 DIAGNOSIS — Z029 Encounter for administrative examinations, unspecified: Secondary | ICD-10-CM

## 2019-02-24 DIAGNOSIS — Z1211 Encounter for screening for malignant neoplasm of colon: Secondary | ICD-10-CM

## 2019-02-24 DIAGNOSIS — R7309 Other abnormal glucose: Secondary | ICD-10-CM

## 2019-02-24 NOTE — Progress Notes (Signed)
Video Visit     Location of Patient: home    Location of Telemedicine Provider: hospital / clinical location    Other participants in telemedicine encounter and roles:  none     This is an established patient visit.    Reason for visit: Follow-up      HPI: Pamela Mccoy is a 64 y.o. female presenting for tele-health visit    Patient needs form completed for work. 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 Not sure she needs a PPD, though form indicates that she may. Needs proof of MMR, Hep B vaccinations or titers.     Has had no chest pain, shortness of breath, palpitation    Has a sweet tooth, states that she is a big eater. Did lose some weight since last visit. A1c in 2018 was 6.0.  Trying to exercise, get up to 10,000 steps every day.    History of positive Hepatitis B test, viral load negative in 2016. Asymptomatic      ROS:  No headache  No chest pain  No shortness of breath  No fever, chills  No diarrhea.      Patient's problem list, allergies, and medications were reviewed and updated as appropriate.  Please see the EHR for full details.  Past Medical History:   Diagnosis Date    Hepatitis B     Hyperlipidemia      Past Surgical History:   Procedure Laterality Date    CESAREAN SECTION, CLASSIC      x 1    CESAREAN SECTION, CLASSIC      Cesarean Section Conversion Data      Family History   Problem Relation Age of Onset    Cancer Mother     Cancer Father     Breast cancer Maternal Aunt        Current Outpatient Medications:     loperamide (IMODIUM) 2 MG capsule, TK ONE C PO  QID PRN FOR DIARRHEA, Disp: , Rfl:     ondansetron (ZOFRAN-ODT) 4 MG disintegrating tablet, DIS ONE T PO  Q 8 H PRN, Disp: , Rfl:   Social History     Tobacco Use    Smoking status: Never Smoker    Smokeless tobacco: Never Used   Substance Use Topics    Alcohol use: Yes     Comment: 1/week    Drug use: No     Allergies   Allergen Reactions    Honey Anaphylaxis    Mango Anaphylaxis     Sunflower Oil Anaphylaxis       Exam and data reviewed:  Vitals:    02/24/19 0727   BP: 118/78   Pulse: 68   Weight: 93 kg (205 lb)     Wt Readings from Last 3 Encounters:   02/24/19 93 kg (205 lb)   12/17/17 96.2 kg (212 lb)   07/17/16 96.2 kg (212 lb)     Patient appears comfortable, in NAD.  Eyes:  Anicteric sclera  Neck:  Full range of motion by report.  Cardiovascular:  No cyanosis, or lower extremity swelling by report.  Pulmonary:   Breathing is not labored.  Mood is normal, speech is fluent.        Assessment & Plan:    1. Administrative encounter    2. Elevated hemoglobin A1c    3. Screening for colon cancer      1. Patient will clarify with  employer if she needs PPD screen. Once known, form will be completed.  She is immune to MMR, hep B per previous lab work.    2. Elevated A1c in the past will recheck fasting lab work. Advised to strive for healthy balanced meals, decrease sweets.    3. Due for screening colonoscopy, she was referred in the past twice, but did not make the appointment. She wants to get a colonoscopy instead of other modalities such as colo-guard. Another GI referral done.     4. Advised to schedule mammogram, she gets these [facility].         Consent was obtained from the patient to complete this video visit; including the potential for financial liability.          Magen Suriano Dianna Rossetti, MD

## 2019-02-24 NOTE — Patient Instructions (Addendum)
Clarify with Employer if PPD is needed  Work forms to be completed once above clarification known  Get fasting labs done soon.   Schedule mammogram  Schedule colonoscopy, referral done

## 2019-03-31 ENCOUNTER — Encounter: Payer: Self-pay | Admitting: Primary Care

## 2019-03-31 ENCOUNTER — Other Ambulatory Visit
Admission: RE | Admit: 2019-03-31 | Discharge: 2019-03-31 | Disposition: A | Discharge status: Home / Self Care | Payer: PRIVATE HEALTH INSURANCE | Source: Ambulatory Visit | Attending: Primary Care | Admitting: Primary Care

## 2019-03-31 DIAGNOSIS — R7309 Other abnormal glucose: Secondary | ICD-10-CM

## 2019-03-31 DIAGNOSIS — R7303 Prediabetes: Secondary | ICD-10-CM

## 2019-03-31 DIAGNOSIS — E785 Hyperlipidemia, unspecified: Secondary | ICD-10-CM | POA: Insufficient documentation

## 2019-03-31 DIAGNOSIS — E559 Vitamin D deficiency, unspecified: Secondary | ICD-10-CM

## 2019-03-31 LAB — COMPREHENSIVE METABOLIC PANEL
ALT: 17 U/L (ref 0–35)
AST: 16 U/L (ref 0–35)
Albumin: 3.9 g/dL (ref 3.5–5.2)
Alk Phos: 74 U/L (ref 35–105)
Anion Gap: 9 (ref 7–16)
Bilirubin,Total: 0.5 mg/dL (ref 0.0–1.2)
CO2: 26 mmol/L (ref 20–28)
Calcium: 9.7 mg/dL (ref 8.6–10.2)
Chloride: 108 mmol/L (ref 96–108)
Creatinine: 0.94 mg/dL (ref 0.51–0.95)
GFR,Black: 75 *
GFR,Caucasian: 65 *
Glucose: 87 mg/dL (ref 60–99)
Lab: 10 mg/dL (ref 6–20)
Potassium: 4.1 mmol/L (ref 3.3–5.1)
Sodium: 143 mmol/L (ref 133–145)
Total Protein: 7.4 g/dL (ref 6.3–7.7)

## 2019-03-31 LAB — LIPID PANEL
Chol/HDL Ratio: 5.7
Cholesterol: 234 mg/dL — AB
HDL: 41 mg/dL (ref 40–60)
LDL Calculated: 173 mg/dL — AB
Non HDL Cholesterol: 193 mg/dL
Triglycerides: 98 mg/dL

## 2019-03-31 LAB — CBC AND DIFFERENTIAL
Baso # K/uL: 0 10*3/uL (ref 0.0–0.1)
Basophil %: 0.4 %
Eos # K/uL: 0.2 10*3/uL (ref 0.0–0.4)
Eosinophil %: 2.6 %
Hematocrit: 43 % (ref 34–45)
Hemoglobin: 13.6 g/dL (ref 11.2–15.7)
IMM Granulocytes #: 0 10*3/uL (ref 0.0–0.0)
IMM Granulocytes: 0.2 %
Lymph # K/uL: 2.6 10*3/uL (ref 1.2–3.7)
Lymphocyte %: 44.8 %
MCH: 27 pg (ref 26–32)
MCHC: 32 g/dL (ref 32–36)
MCV: 86 fL (ref 79–95)
Mono # K/uL: 0.4 10*3/uL (ref 0.2–0.9)
Monocyte %: 7.7 %
Neut # K/uL: 2.5 10*3/uL (ref 1.6–6.1)
Nucl RBC # K/uL: 0 10*3/uL (ref 0.0–0.0)
Nucl RBC %: 0 /100 WBC (ref 0.0–0.2)
Platelets: 237 10*3/uL (ref 160–370)
RBC: 5 MIL/uL (ref 3.9–5.2)
RDW: 13.7 % (ref 11.7–14.4)
Seg Neut %: 44.3 %
WBC: 5.7 10*3/uL (ref 4.0–10.0)

## 2019-03-31 LAB — HEMOGLOBIN A1C: Hemoglobin A1C: 6.1 % — ABNORMAL HIGH

## 2019-03-31 LAB — VITAMIN D: 25-OH Vit Total: 19 ng/mL — ABNORMAL LOW (ref 30–60)

## 2019-03-31 LAB — MICROALBUMIN, URINE, RANDOM
Creatinine,UR: 298 mg/dL (ref 20–300)
Microalb/Creat Ratio: 8.3 mg MA/g CR (ref 0.0–29.9)
Microalbumin,UR: 2.46 mg/dL

## 2019-03-31 LAB — TSH: TSH: 0.59 u[IU]/mL (ref 0.27–4.20)

## 2019-03-31 MED ORDER — ERGOCALCIFEROL 50000 UNIT PO CAPS *I*
50000.0000 [IU] | ORAL_CAPSULE | ORAL | 1 refills | Status: AC
Start: 2019-03-31 — End: 2019-09-27

## 2019-03-31 NOTE — Telephone Encounter (Signed)
The 10-year ASCVD risk score Mikey Bussing DC Brooke Bonito., et al., 2013) is: 6.1%    Values used to calculate the score:      Age: 64 years      Sex: Female      Is Non-Hispanic African American: Yes      Diabetic: No      Tobacco smoker: No      Systolic Blood Pressure: 123456 mmHg      Is BP treated: No      HDL Cholesterol: 41 mg/dL      Total Cholesterol: 234 mg/dL

## 2019-04-15 ENCOUNTER — Other Ambulatory Visit: Payer: Self-pay | Admitting: Gastroenterology

## 2019-04-15 LAB — HM MAMMOGRAPHY

## 2019-04-18 ENCOUNTER — Telehealth: Payer: Self-pay | Admitting: Primary Care

## 2019-04-18 DIAGNOSIS — B181 Chronic viral hepatitis B without delta-agent: Secondary | ICD-10-CM

## 2019-04-18 NOTE — Telephone Encounter (Signed)
Labs ordered.

## 2019-09-14 NOTE — H&P (Deleted)
History and Physical    HISTORY:  No chief complaint on file.        History of Present Illness:    HPI    History of positive Hepatitis B test, viral load negative in 2016. Asymptomatic    Hyperlipidemia: Lipids done months ago, LDL 173.     Prediabetes: A1C was 6.1 five months ago. No polyuria, polydipsia.     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.9    Hyperlipidemia E78.5    Prediabetes R73.03        Past Medical/Surgical History:   Past Medical History:   Diagnosis Date    Hepatitis B     Hyperlipidemia      Past Surgical History:   Procedure Laterality Date    CESAREAN SECTION, CLASSIC      x 1    CESAREAN SECTION, CLASSIC      Cesarean Section Conversion Data        Allergies:    Allergies   Allergen Reactions    Honey Anaphylaxis    Mango Anaphylaxis    Sunflower Oil Anaphylaxis       Current medications:    Current Outpatient Medications   Medication Sig    ergocalciferol (ERGOCALCIFEROL) 50000 UNIT capsule Take 1 capsule (50,000 units total) by mouth once a week    loperamide (IMODIUM) 2 MG capsule TK ONE C PO  QID PRN FOR DIARRHEA    ondansetron (ZOFRAN-ODT) 4 MG disintegrating tablet DIS ONE T PO  Q 8 H PRN       Family History:    Family History   Problem Relation Age of Onset    Cancer Mother     Cancer Father     Breast cancer Maternal Aunt      Social/Occupational History:   Social History     Socioeconomic History    Marital status: Widowed     Spouse name: Not on file    Number of children: Not on file    Years of education: Not on file    Highest education level: Not on file   Tobacco Use    Smoking status: Never Smoker    Smokeless tobacco: Never Used   Substance and Sexual Activity    Alcohol use: Yes     Comment: 1/week    Drug use: No    Sexual activity: Never     Partners:  Male   Other Topics Concern    Not on file   Social History Narrative    ** Merged History Encounter **            Review of Systems:    ROS    Vital Signs:   There were no vitals taken for this visit.      PHYSICAL EXAM:  Physical Exam    Lab Results   Component Value Date    HA1C 6.1 (H) 03/31/2019         Lab results: 03/31/19  0922   Sodium 143   Potassium 4.1   Chloride 108   CO2 26   UN 10   Creatinine 0.94   GFR,Caucasian 65   GFR,Black 75   Glucose 87   Calcium 9.7   Total Protein  7.4   Albumin 3.9   ALT 17   AST 16   Alk Phos 74   Bilirubin,Total 0.5         Lab results: 03/31/19  0922   Cholesterol 234*   HDL 41   LDL Calculated 173*   Triglycerides 98   Non HDL Cholesterol 193   Chol/HDL Ratio 5.7         Assessment:    There are no diagnoses linked to this encounter.   .      Plan:       ***      Hv  Colon. Refer to [facility] 03/2019  shing  Labs acp

## 2019-09-15 ENCOUNTER — Encounter: Payer: Self-pay | Admitting: Primary Care

## 2019-09-15 ENCOUNTER — Encounter: Payer: PRIVATE HEALTH INSURANCE | Admitting: Primary Care

## 2019-09-16 ENCOUNTER — Encounter: Payer: Self-pay | Admitting: Primary Care

## 2019-09-17 ENCOUNTER — Encounter: Payer: Self-pay | Admitting: Primary Care

## 2019-09-17 ENCOUNTER — Telehealth: Payer: Self-pay | Admitting: Primary Care

## 2019-09-17 NOTE — Telephone Encounter (Signed)
Report Request   by Data Analyst     Select F2 to Complete Form     Called: [facility]/Dr. Rosann Auerbach   Requested: Cscope referral status   Outcome: Spoke with [name]; Patient was mailed intake paperwork in Feb. Paperwork not returned.    Will mail letter to pt      Is the report for a Metric? yes      By: Leonard Downing   Date: 09/17/19

## 2019-10-22 IMAGING — MR MR KNEE RT WO
4 of 5 series · 20 of 40 positions shown · non-contrast
Comparison: none

Exam:MRI right knee
REASON FOR EXAM: Right knee pain
TECHNIQUE: Sagittal PD, sagittal PD fat-sat, coronal PD fat-sat, and axial PD fat sat sequences were obtained through the right knee.

[Series 501: PD fat-sat · axial · 4.0mm · 0.42mm/px · z∈[-120,+6]mm · 8 of 30 slices shown (1 of 3)]
[im 1/30]
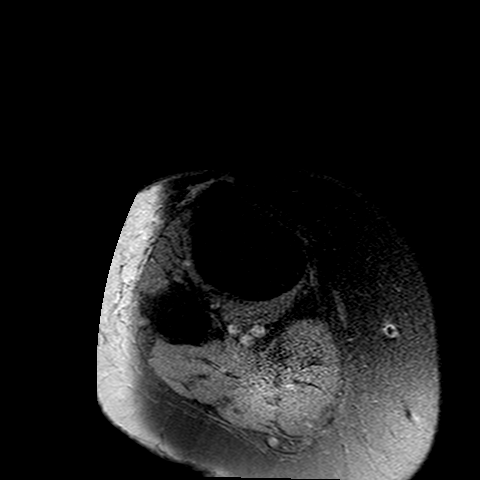
[im 4/30]
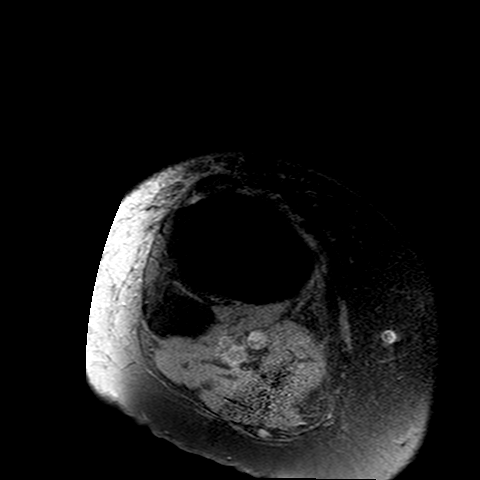
[im 10/30]
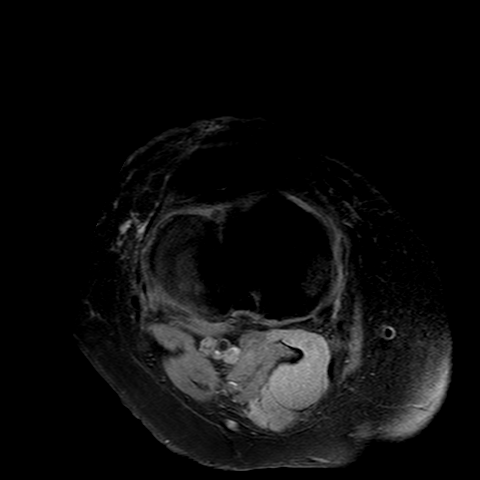
[im 13/30]
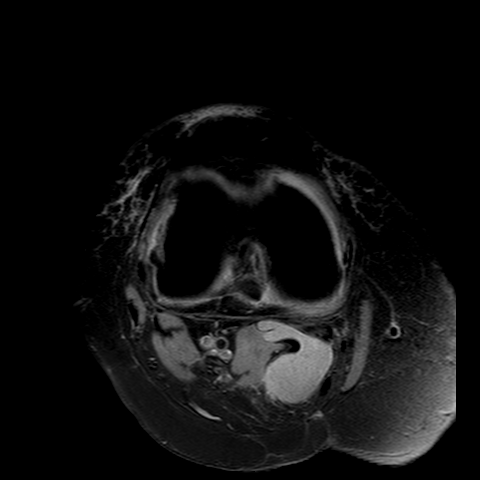
[im 17/30]
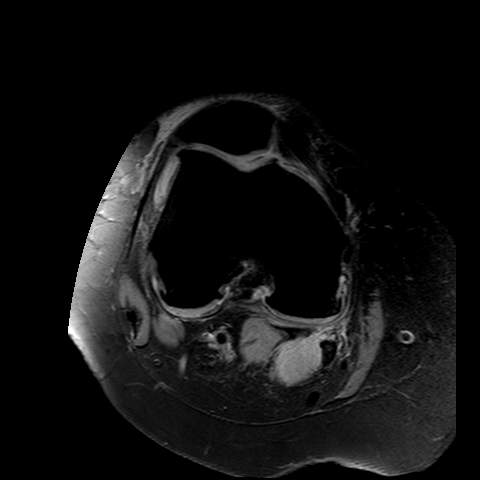
[im 20/30]
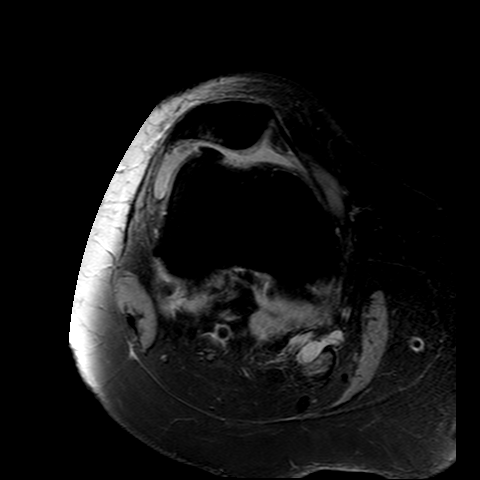
[im 26/30]
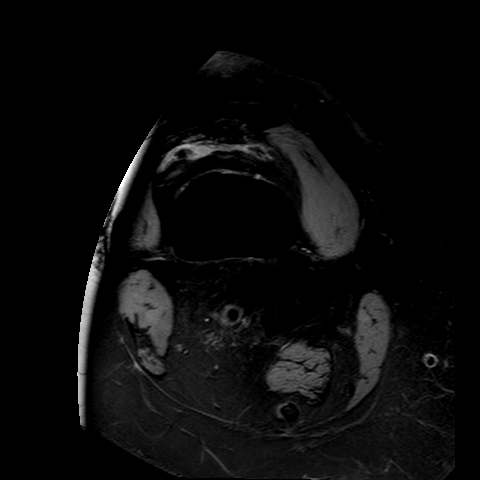
[im 30/30]
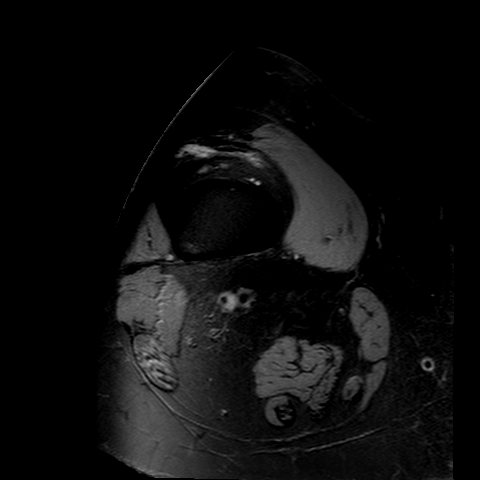

[Series 601: PD · sagittal · 4.0mm · 0.33mm/px · 3 of 30 slices shown]
[im 4/30]
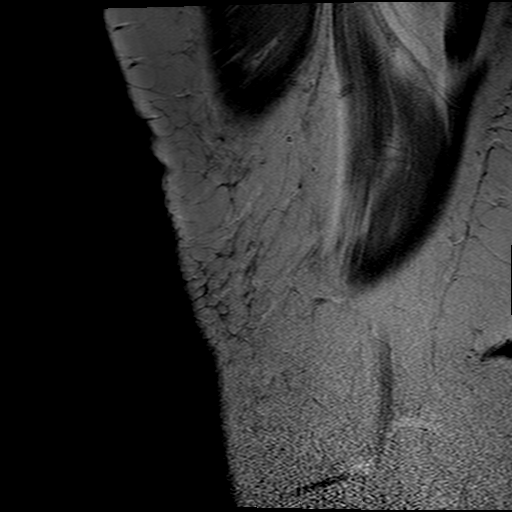
[im 17/30]
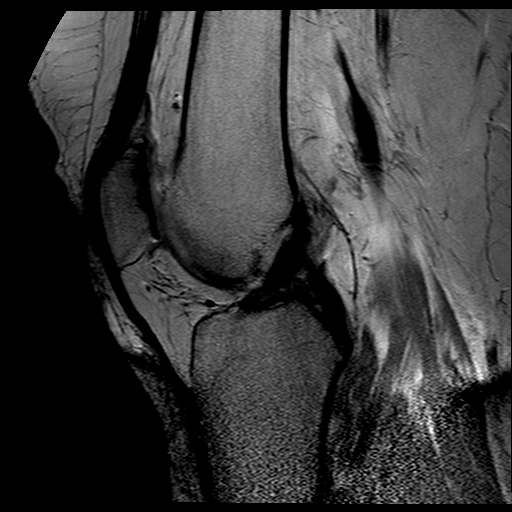
[im 26/30]
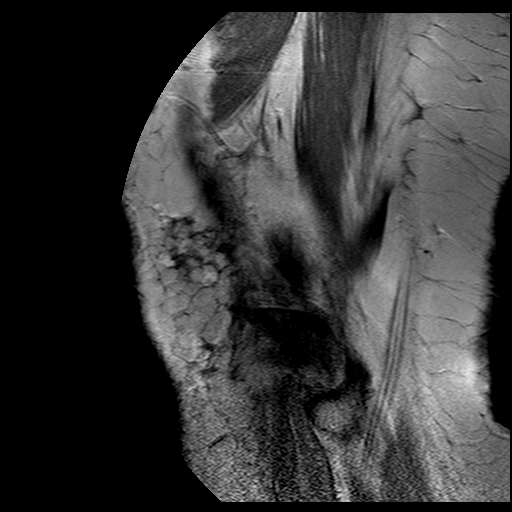

[Series 701: PD fat-sat · sagittal · 4.0mm · 0.33mm/px · 6 of 26 slices shown (2 of 3)]
[im 1/26]
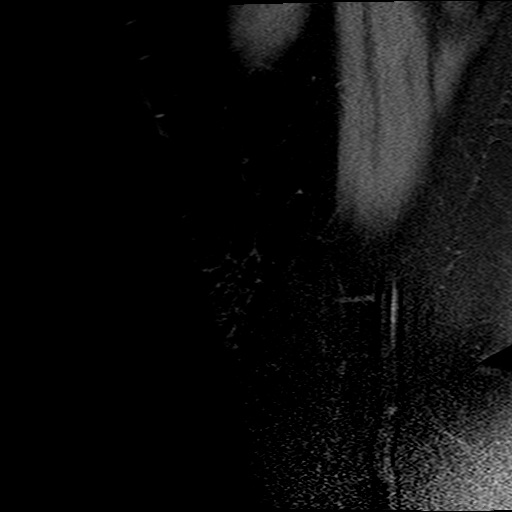
[im 4/26]
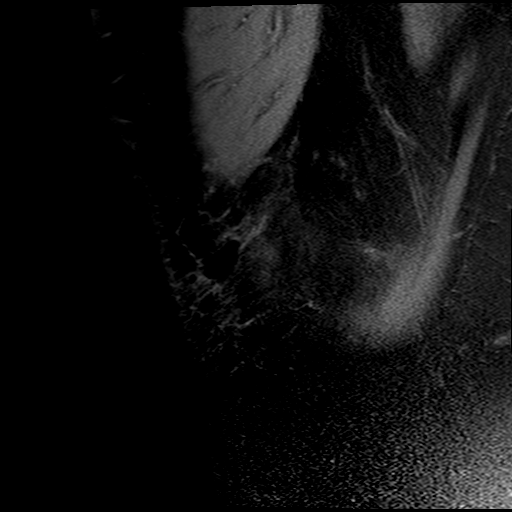
[im 8/26]
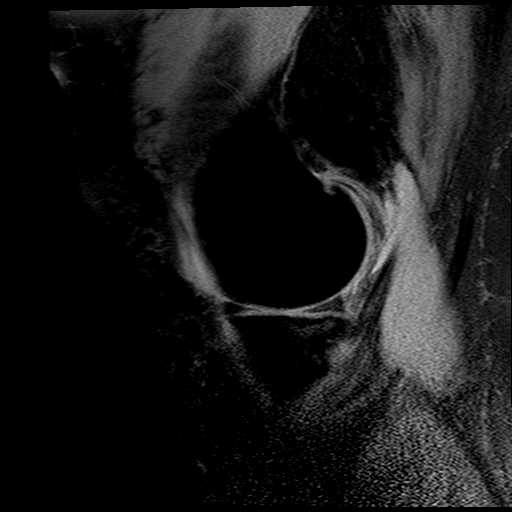
[im 11/26]
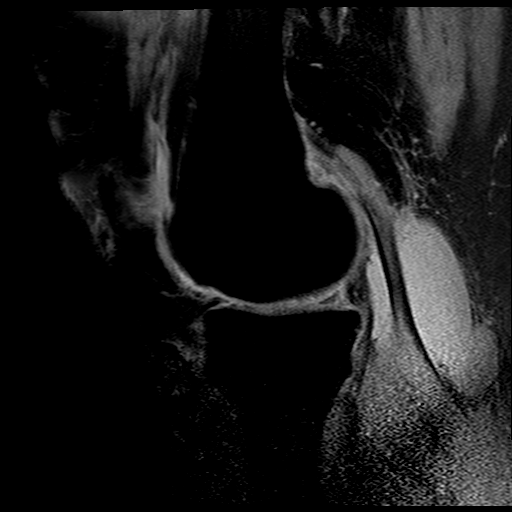
[im 15/26]
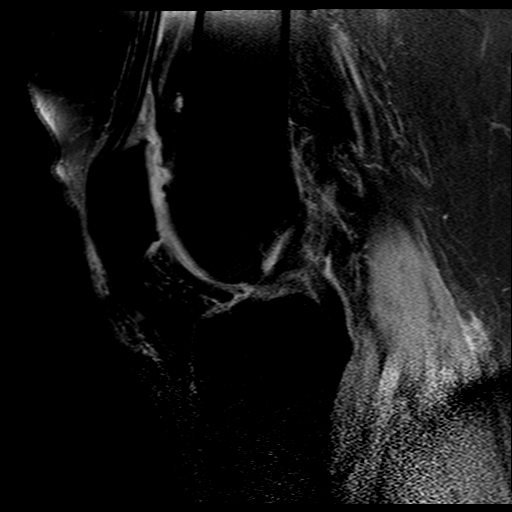
[im 22/26]
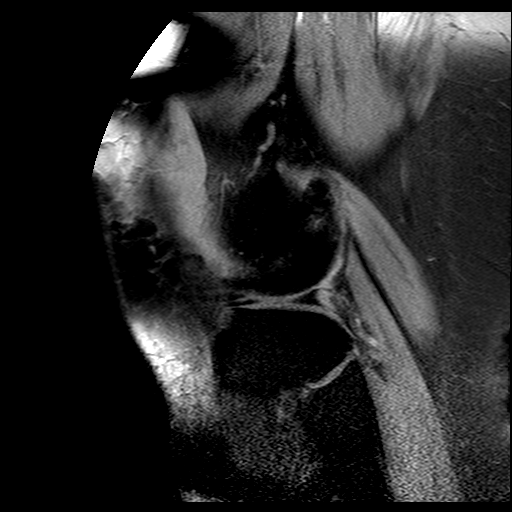

[Series 801: PD fat-sat · coronal · 4.0mm · 0.33mm/px · 3 of 30 slices shown (3 of 3)]
[im 4/30]
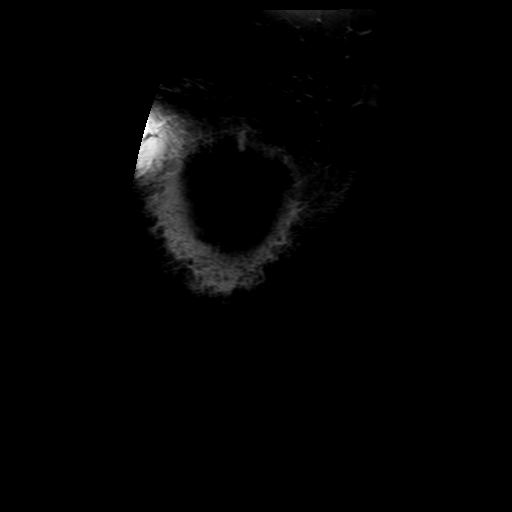
[im 17/30]
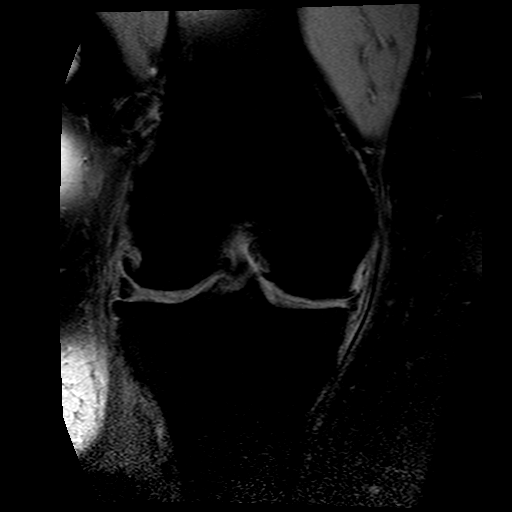
[im 26/30]
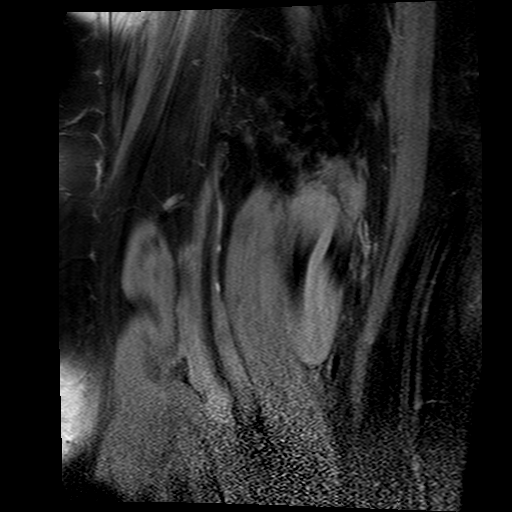

[20 of 40 positions shown; findings below may reference images not displayed]

FINDINGS: There is a complex tear of the posterior horn of the medial meniscus. There is a horizontal oblique tear of the anterior horn of the lateral meniscus. The anterior and posterior cruciate ligaments are normal. The extensor mechanism is within normal limits.
The medial collateral ligament and the lateral collateral ligament complex are within normal limits. Mild partial-thickness articular cartilage defects are seen in the medial compartment. Mild to moderate partial-thickness articular cartilage defects are seen involving the lateral facet of the patella and the lateral trochlea. The articular cartilage of the lateral compartment is normal.
A small joint effusion is seen. A small to moderate sized Baker's cyst is present.
IMPRESSION: 1. Complex tear involving the posterior horn of the medial meniscus.
2. Horizontal tear of the anterior horn of the lateral meniscus.
Location:1

## 2020-01-22 IMAGING — DX XR CHEST 1 VIEW
1 series · 4 of 4 positions shown · non-contrast
Comparison: none

+covid, malaise.
SINGLE PORTABLE CHEST:
CLINICAL INDICATION: cough
REFERENCE: 07/09/2017

[Series 260: AP · U · 0.10mm/px · 4 of 4 slices shown]
[im 1/4]
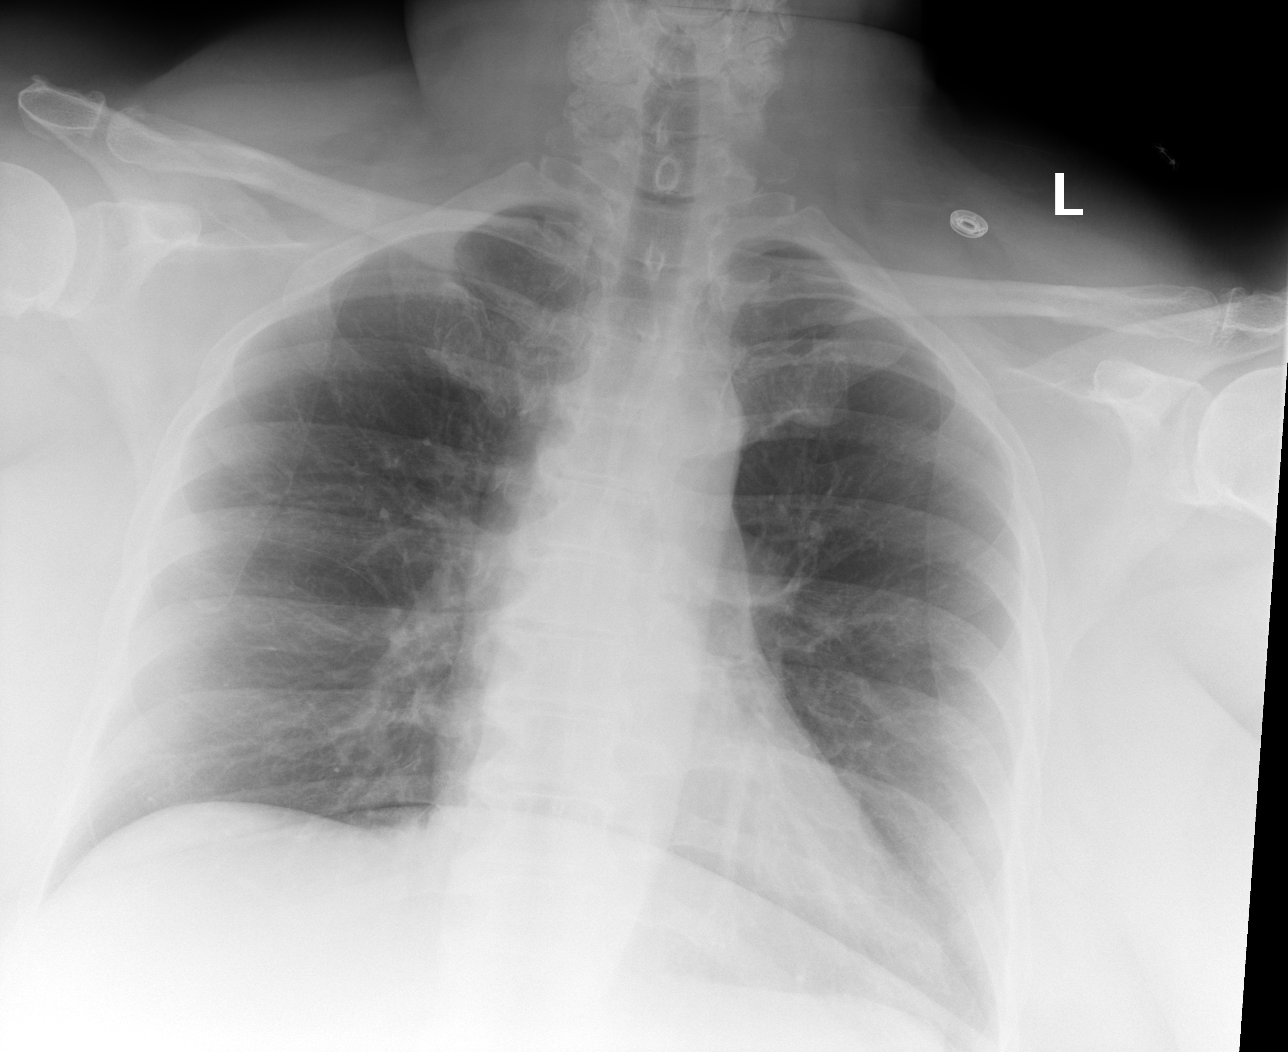
[im 2/4]
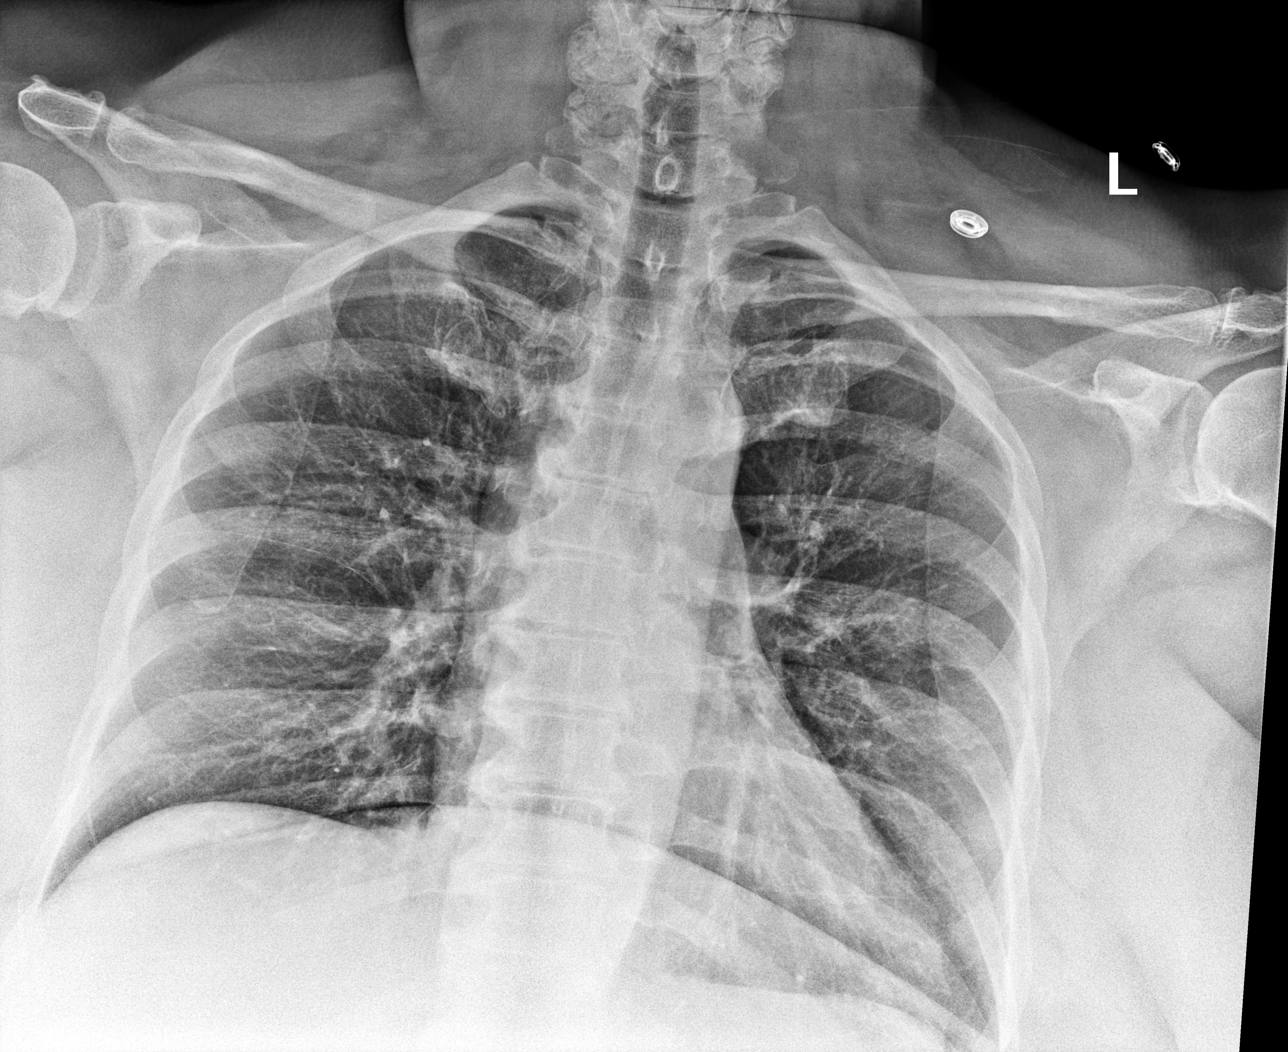
[im 3/4]
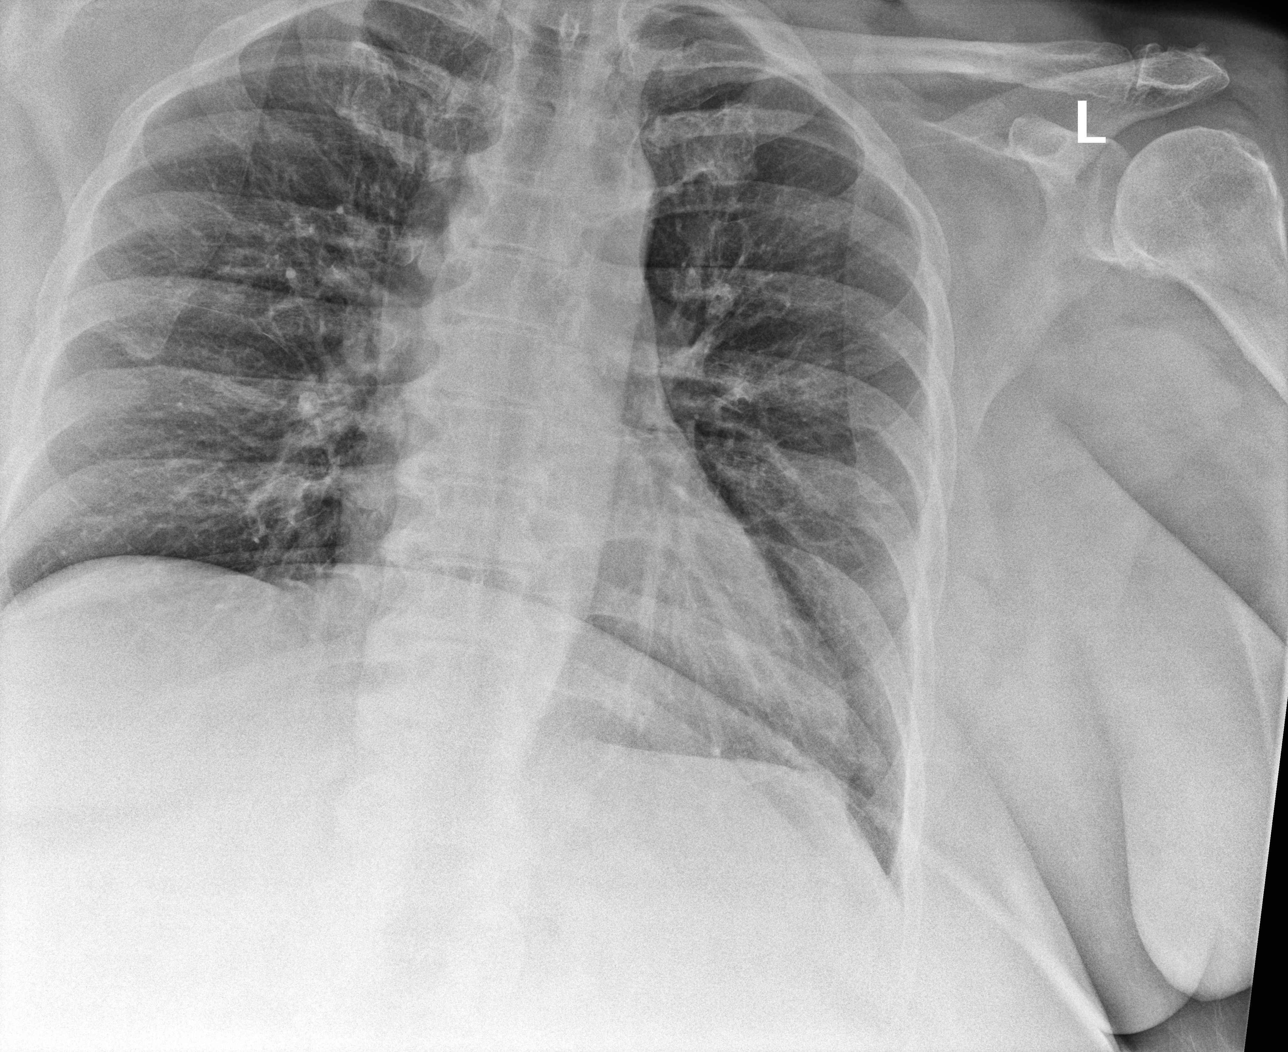
[im 4/4]
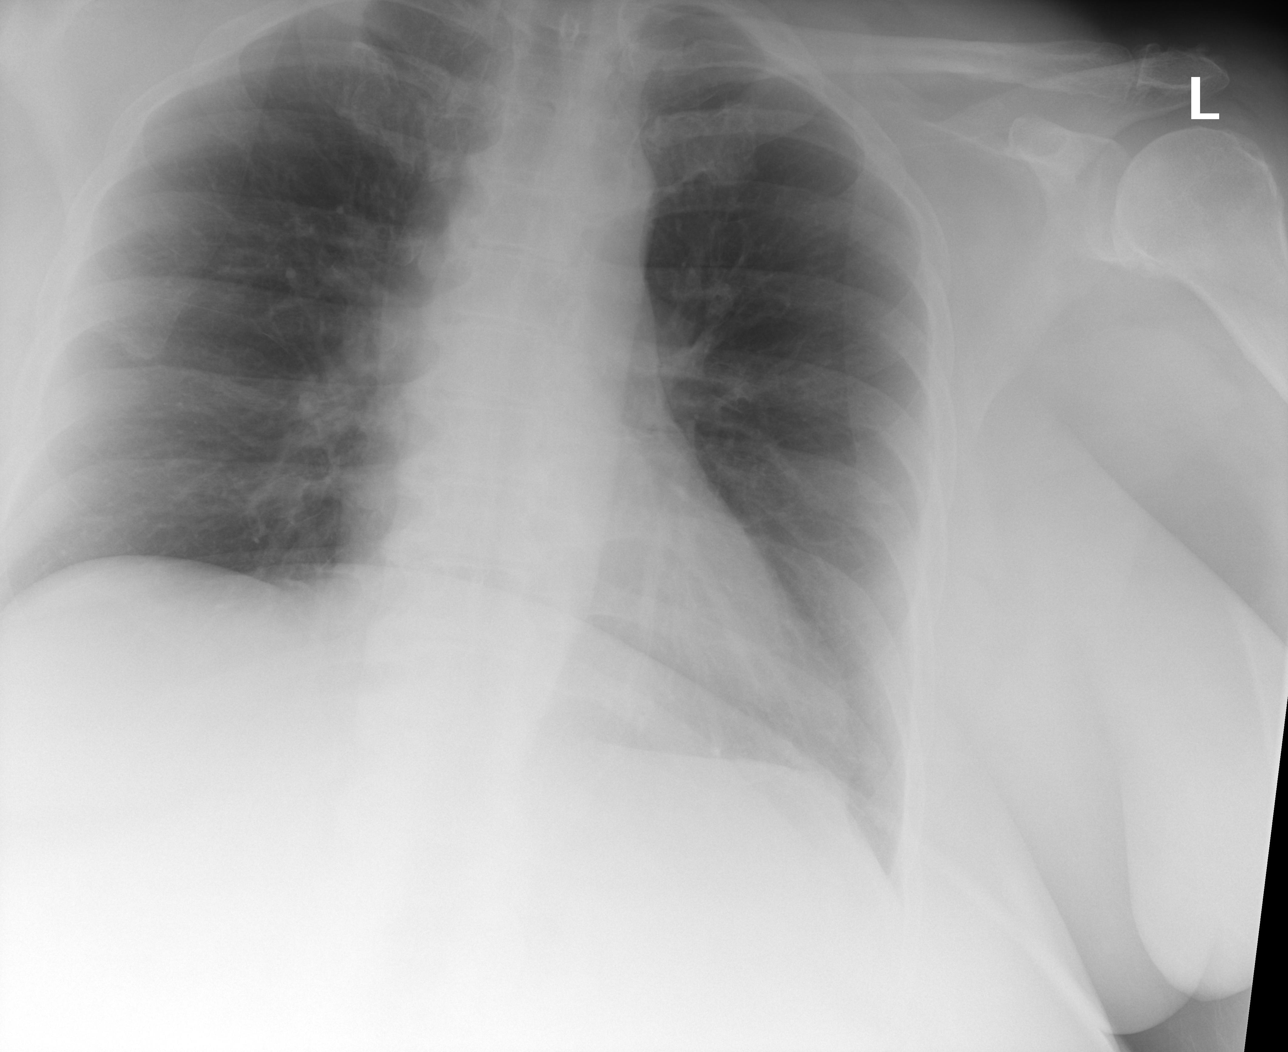

[4 of 4 positions shown; findings below may reference images not displayed]

FINDINGS: Single radiograph of the chest demonstrates normal cardiomediastinal contours.
The lungs demonstrate no mass, infiltrate, or effusion.
Surrounding osseous and soft tissue structures are unremarkable.
IMPRESSION: No acute cardiopulmonary process identified.
LOCATION CODE: 1

## 2020-01-22 IMAGING — CT CT CHEST PULMONARY EMBOLISM W IV CONTRAST
2 of 5 series · 11 of 30 positions shown · IV contrast (ISOVUE 370)
Comparison: none

CTA OF THE CHEST PULMONARY EMBOLUS PROTOCOL:
CLINICAL INDICATION:  Pulmonary embolism (PE) suspected, unknown D-dimer
REFERENCE:  07/07/2017
TECHNIQUE: Informed consent was obtained for the administration of contrast.  Following the timed administration of intravenous contrast, axial imaging was obtained through the central portion of the chest to include the pulmonary arteries.  Additional delayed full chest imaging was then obtained.  Subsequent coronal slab MIP reconstruction was obtained.  No untoward contrast reaction was reported.

[Series 5: pe chest · axial · 0.94mm/px · z∈[-232,-138]mm · 3 of 114 slices shown]
[im 38/114  lung]
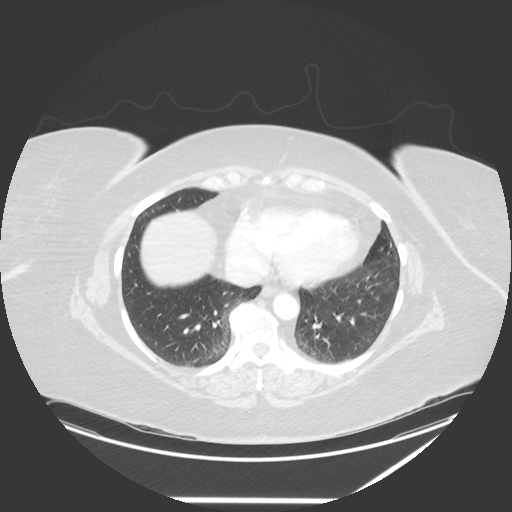
[im 74/114  mediastinal]
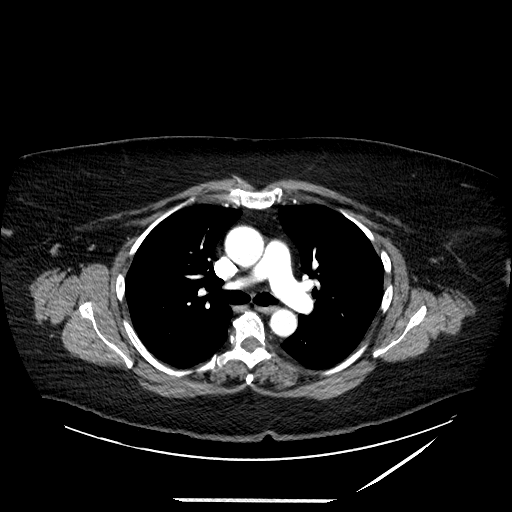
[im 76/114  lung]
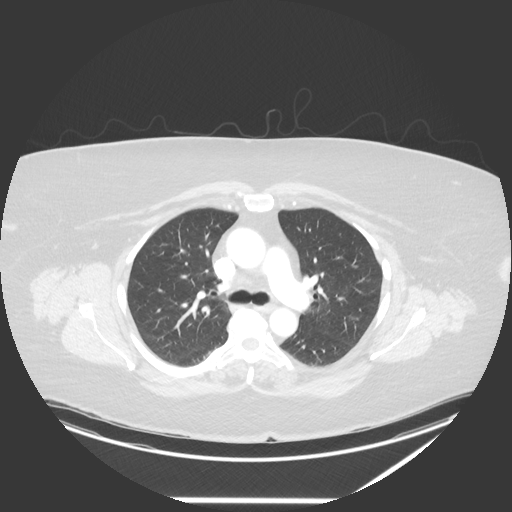

[Series 606: sagittal · sagittal · 0.94mm/px · 8 of 241 slices shown]
[im 27/241  lung]
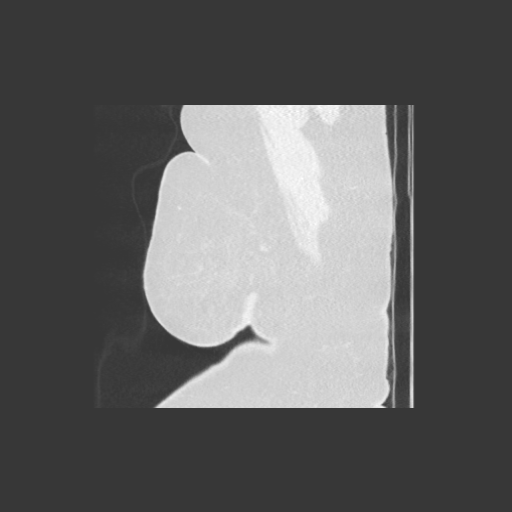
[im 54/241  lung]
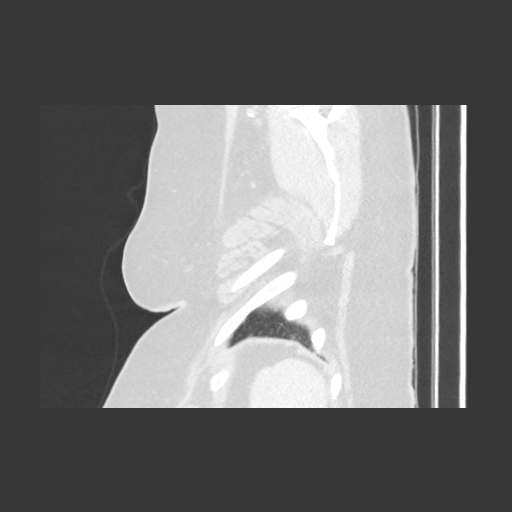
[im 81/241  lung]
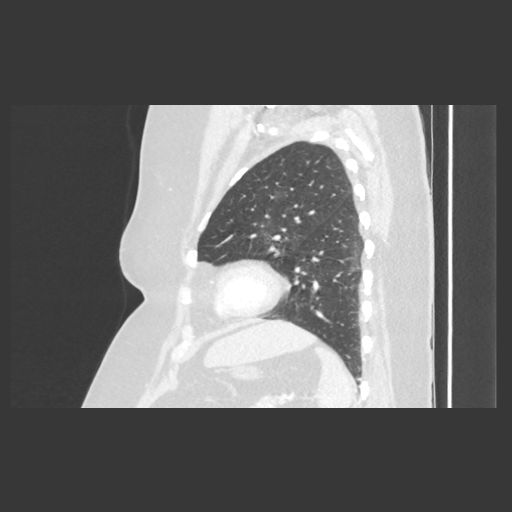
[im 107/241  lung]
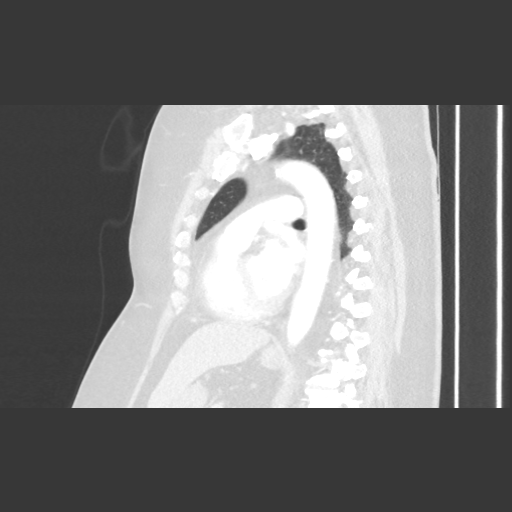
[im 134/241  lung]
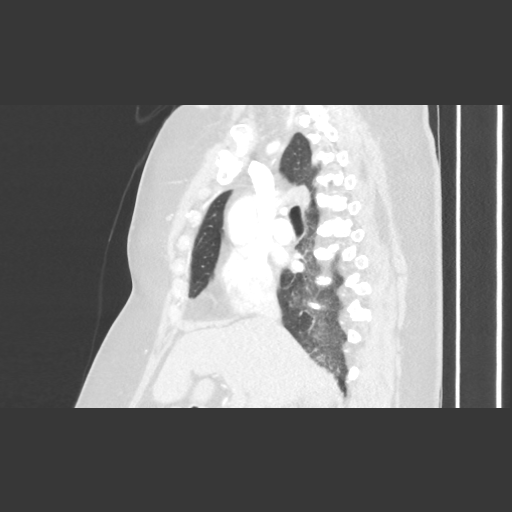
[im 161/241  lung]
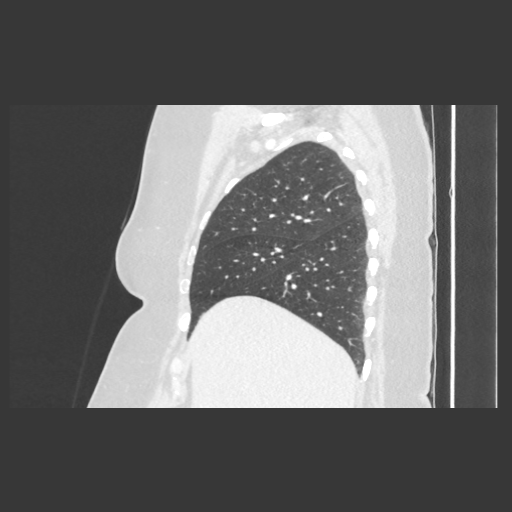
[im 187/241  lung]
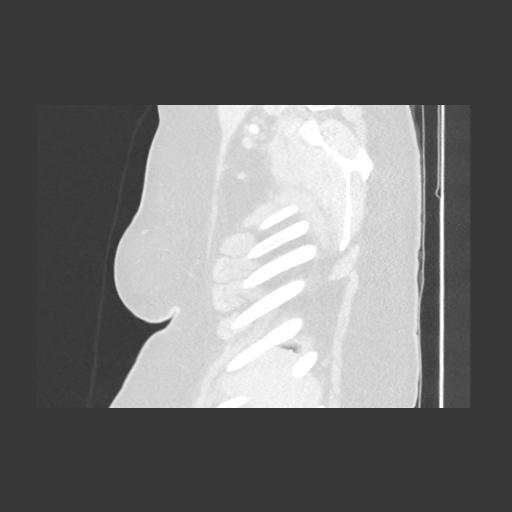
[im 214/241  lung]
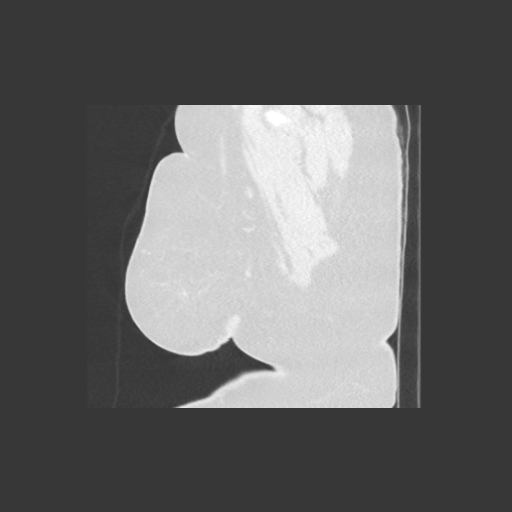

[11 of 30 positions shown; findings below may reference images not displayed]

FINDINGS: Adequate opacification of the central pulmonary arteries is achieved. Limited opacification of the segmental and subsegmental pulmonary arterial branches. No filling defects are evident.  Heart size is normal.  Aorta appears intact with no evidence of aneurysmal change or dissection.
The airway is patent and unremarkable.  No bronchiectasis or bronchial wall thickening is present.
No mediastinal or hilar adenopathy is appreciated.
Lungs are clear with no infiltrate, mass, or effusion identified. Minimal dependent atelectasis within the lung bases bilaterally. No significant interstitial thickening or emphysematous change.
Chest wall structures appear intact and unremarkable.
Limited imaging of the upper abdomen demonstrates no acute process.
IMPRESSION: No pulmonary embolus identified.
Minimal dependent atelectasis within the lung bases.
All CT scans at this facility use dose modulation, iterative reconstruction, and/or weight-based dosing when appropriate to reduce radiation dose to as low as reasonably achievable.
LOCATION CODE: 1

## 2020-01-25 ENCOUNTER — Encounter: Payer: Self-pay | Admitting: Gastroenterology

## 2020-01-27 ENCOUNTER — Encounter: Payer: Self-pay | Admitting: Primary Care

## 2020-01-28 ENCOUNTER — Encounter: Payer: Self-pay | Admitting: Primary Care

## 2020-01-28 ENCOUNTER — Ambulatory Visit: Payer: PRIVATE HEALTH INSURANCE | Admitting: Primary Care

## 2020-01-28 VITALS — HR 74 | Temp 98.6°F

## 2020-01-28 DIAGNOSIS — R5383 Other fatigue: Secondary | ICD-10-CM

## 2020-01-28 DIAGNOSIS — U071 COVID-19: Secondary | ICD-10-CM

## 2020-01-28 DIAGNOSIS — Z1211 Encounter for screening for malignant neoplasm of colon: Secondary | ICD-10-CM

## 2020-01-28 NOTE — Progress Notes (Signed)
Video Visit   The patient understands the benefits and limitations of this visit   Location of Patient: home    Location of Telemedicine Provider: hospital / clinical location    Other participants in telemedicine encounter and roles:  none     This is an established patient visit.    Reason for visit: COVID-19 Concern      HPI: Pamela Mccoy is a 65 y.o. female presenting for tele-health visit    Tested positive for covid-19 01/22/20. Was exposed to household member who had covid. 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 She tested herself, she was negative. Went to work the next day, felt ill, legs felt wobbly. Subsequently developed nasal congestion, headache, body aches, chills. Also with metallic taste in mouth.   Subsequently tested positive for covid. Isolated for 5 days as instructed by Department of HEALTH. Marland Kitchen Feels 80% better. Still with weakness, fatigue. Supposed to return to work tonight at 10 pm, but not feeling symptomatically up to it. Need Dr's note.   No shortness of breath, chest pain or diarrhea.      Referred for colonoscopy multiple times, not done, she does not want this.    ROS:  No headache  No chest pain  No shortness of breath  No diarrhea.    Patient's problem list, allergies, and medications were reviewed and updated as appropriate.  Please see the EHR for full details.  Past Medical History:   Diagnosis Date    Hepatitis B     Hyperlipidemia      Past Surgical History:   Procedure Laterality Date    CESAREAN SECTION, CLASSIC      x 1    CESAREAN SECTION, CLASSIC      Cesarean Section Conversion Data      Family History   Problem Relation Age of Onset    Cancer Mother     Cancer Father     Breast cancer Maternal Aunt        Current Outpatient Medications:     fluticasone (FLONASE) 50 MCG/ACT nasal spray, Spray 1 spray into nostril daily, Disp: , Rfl:   Social History     Tobacco Use    Smoking status: Never Smoker    Smokeless tobacco: Never Used    Substance Use Topics    Alcohol use: Yes     Comment: 1/week    Drug use: No     Allergies   Allergen Reactions    Honey Anaphylaxis    Mango Anaphylaxis    Sunflower Oil Anaphylaxis     Exam and data reviewed:  Vitals:    01/28/20 0750   Pulse: 74   Temp: 37 C (98.6 F)   TempSrc: Oral     Wt Readings from Last 3 Encounters:   02/24/19 93 kg (205 lb)   12/17/17 96.2 kg (212 lb)   07/17/16 96.2 kg (212 lb)     General: Patient sitting in chair. No acute distress noted.   Skin: Clean and dry no rashes or lesions noted.   Eyes: Conjunctiva are clear, no discharge or lid edema noted.   ENT: No nasal discharge noted. Voice was not raspy.   Respiratory: Breathing was unlabored, no cough noted. No audible wheezes.       Lab Results   Component Value Date    NA 143 03/31/2019    K 4.1 03/31/2019    CL 108 03/31/2019    CO2 26 03/31/2019  UN 10 03/31/2019    CREAT 0.94 03/31/2019    VID25 19 (L) 03/31/2019    WBC 5.7 03/31/2019    HGB 13.6 03/31/2019    HCT 43 03/31/2019    PLT 237 03/31/2019    TSH 0.59 03/31/2019    CHOL 234 (!) 03/31/2019    TRIG 98 03/31/2019    HDL 41 03/31/2019    LDLC 173 (!) 03/31/2019    [place] 5.7 03/31/2019       Assessment & Plan:    1. COVID-19 virus infection    2. Fatigue, unspecified type    3. Colon cancer screening      1/2: Fatigue associated with COVID-19 infection, improving, since she is still having some symptoms (thougth fatigue can last for some time). Recommend increased fluids, rest, balanced diet, humidifier, etc.   If worsening symptoms, will contact office.   Agree that further time from work recommended, work note provided.    3. Patient declines colonoscopy, agrees to colo-guard, test ordered.     Consent was obtained from the patient to complete this video visit; including the potential for financial liability.          Roxie Kreeger Dianna Rossetti, MD

## 2020-01-29 ENCOUNTER — Encounter: Payer: Self-pay | Admitting: Primary Care

## 2020-05-03 ENCOUNTER — Telehealth: Payer: Self-pay | Admitting: Primary Care

## 2020-05-03 DIAGNOSIS — E785 Hyperlipidemia, unspecified: Secondary | ICD-10-CM

## 2020-05-03 DIAGNOSIS — E559 Vitamin D deficiency, unspecified: Secondary | ICD-10-CM

## 2020-05-03 DIAGNOSIS — R7303 Prediabetes: Secondary | ICD-10-CM

## 2020-05-03 NOTE — Telephone Encounter (Signed)
Fasting labs, Colo-guard reminder letter created, to be mailed to patient.

## 2020-05-31 IMAGING — CR XR HAND 3+ VIEWS BILATERAL
7 series · 7 of 7 positions shown · non-contrast
Comparison: none

FINAL REPORT:
XR HAND 3+ VIEWS BILATERAL
Three views of the left hand.
INDICATION: Pain in right arm
Pain in left arm
AP, lateral, and oblique radiographs of the left hand.  No fracture, dislocation, or malalignment. Mild first CMC joint degenerative changes.  No suspicious osseous lesions are evident.  Soft tissues are grossly unremarkable.

[PA (1 of 2)]
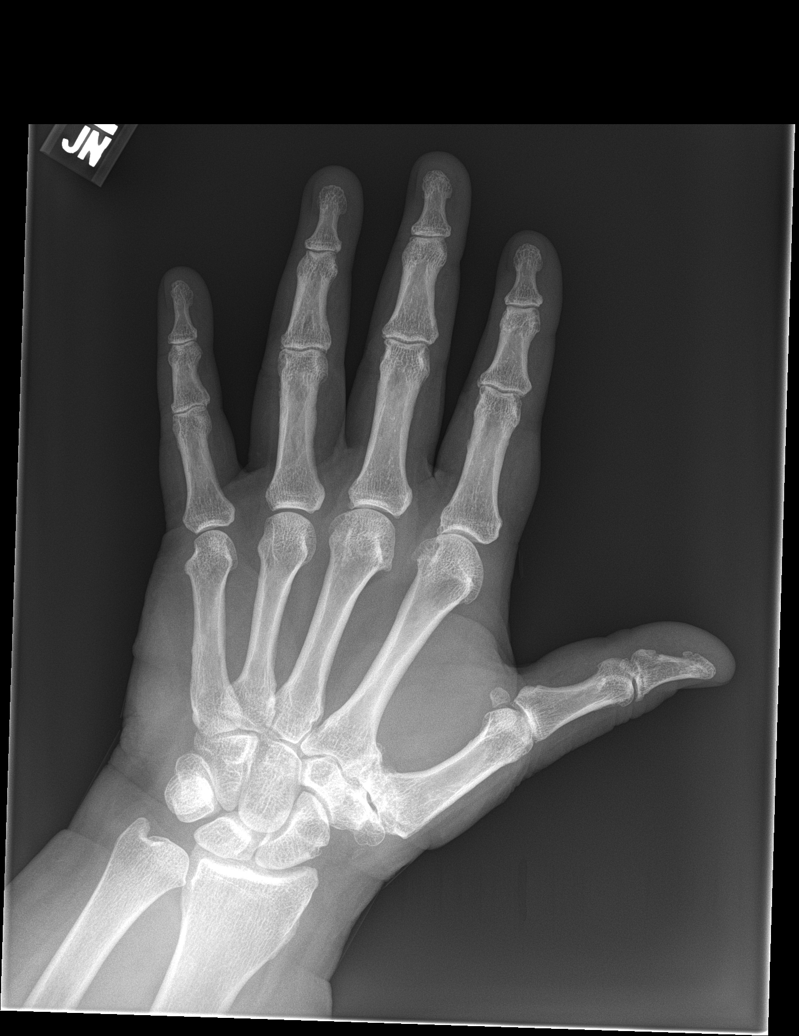

[llo]
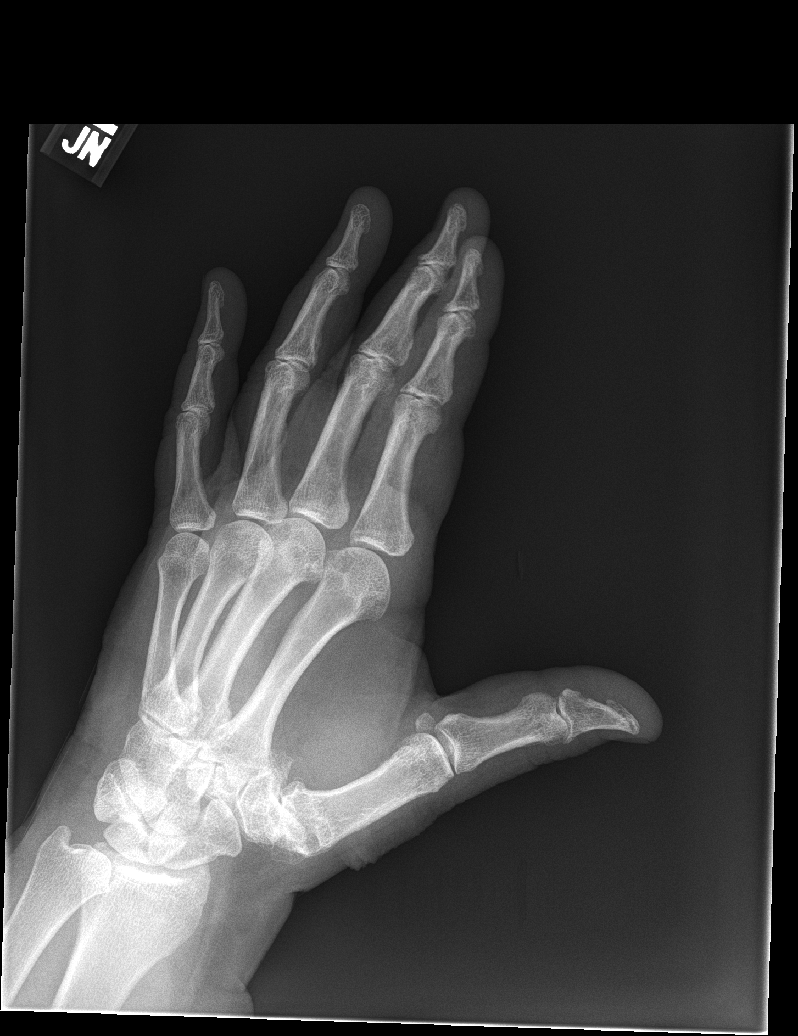

[left lateral]
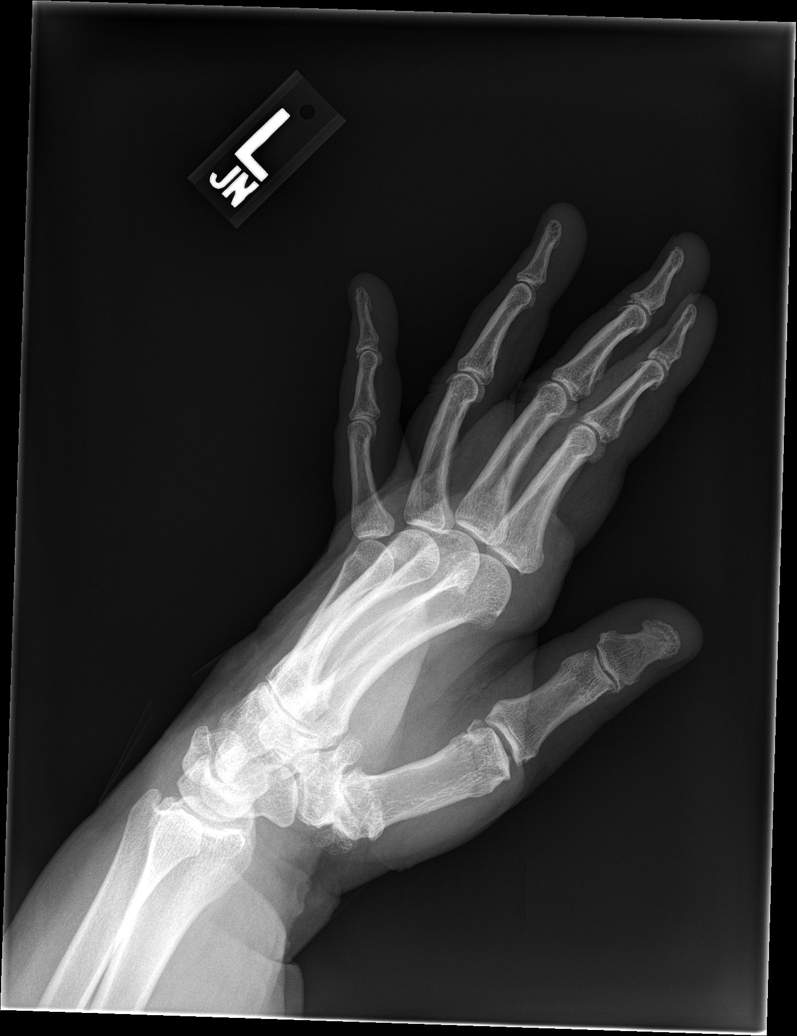

[PA (2 of 2)]
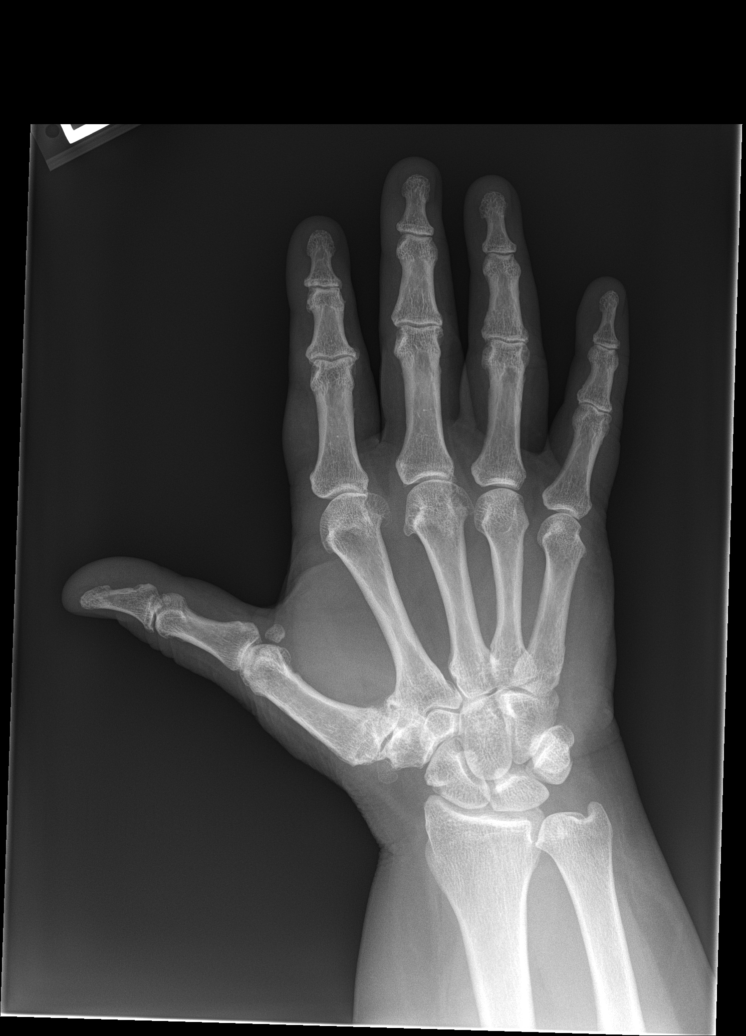

[rlo]
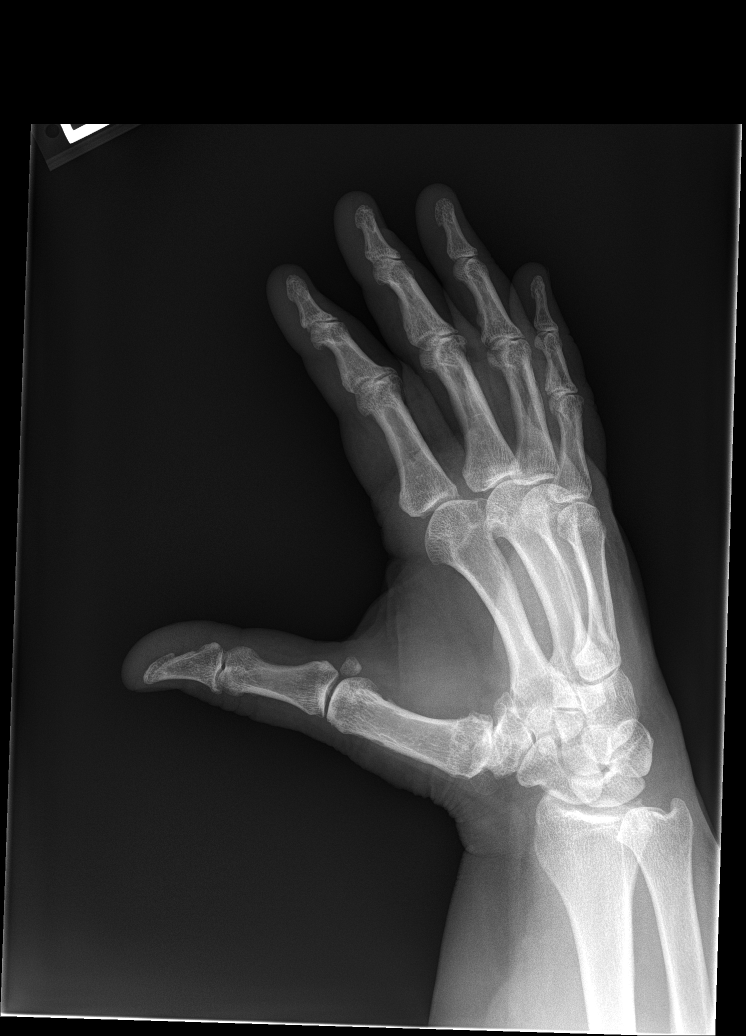

[right lateral (1 of 2)]
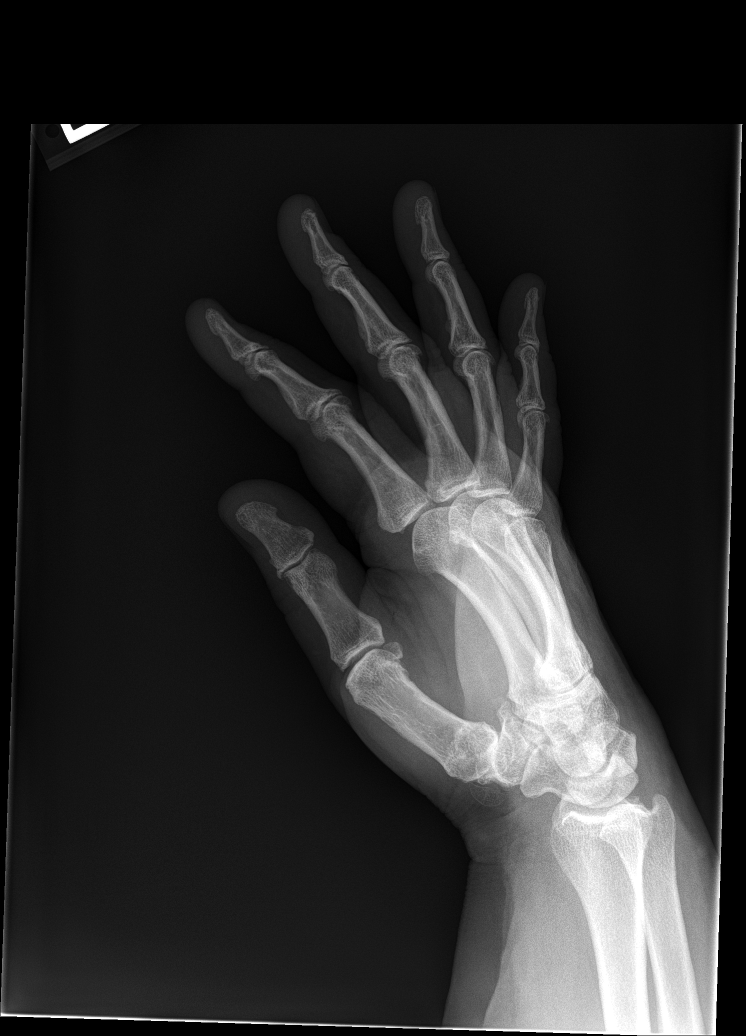

[right lateral (2 of 2)]
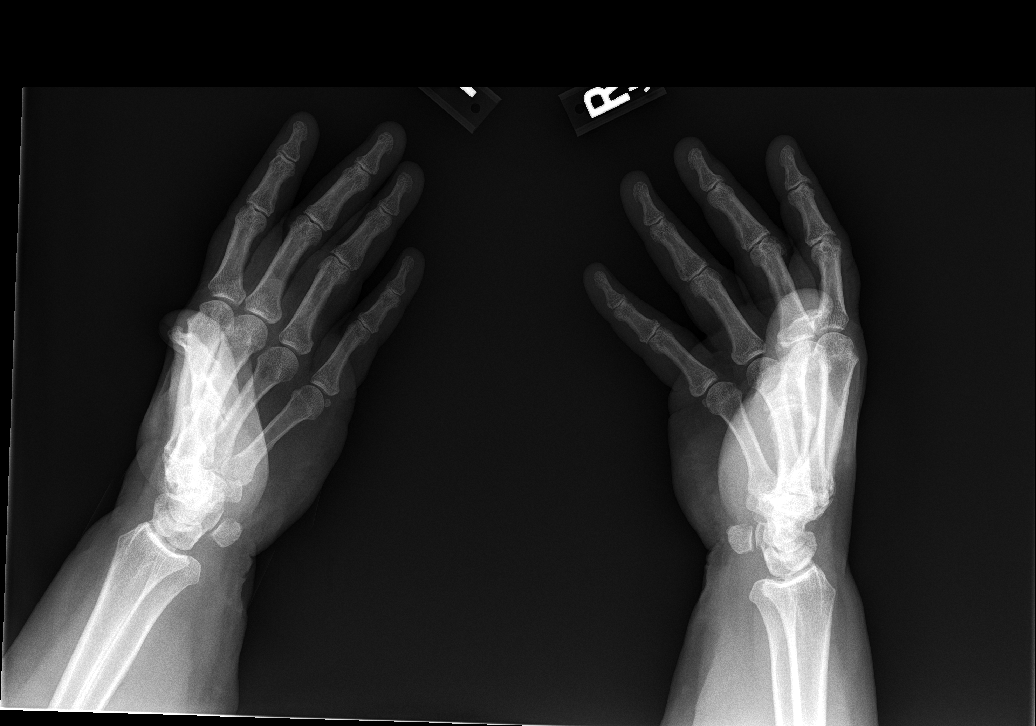

[7 of 7 positions shown; findings below may reference images not displayed]

IMPRESSION: 
IMPRESSION: 1. No acute left hand radiographic abnormality.
2. Mild osteoarthritic degenerative changes.
Location 4

## 2020-09-29 ENCOUNTER — Other Ambulatory Visit
Admission: RE | Admit: 2020-09-29 | Discharge: 2020-09-29 | Disposition: A | Discharge status: Home / Self Care | Payer: PRIVATE HEALTH INSURANCE | Source: Ambulatory Visit | Attending: Primary Care | Admitting: Primary Care

## 2020-09-29 DIAGNOSIS — E559 Vitamin D deficiency, unspecified: Secondary | ICD-10-CM | POA: Insufficient documentation

## 2020-09-29 DIAGNOSIS — R7303 Prediabetes: Secondary | ICD-10-CM | POA: Insufficient documentation

## 2020-09-29 DIAGNOSIS — E785 Hyperlipidemia, unspecified: Secondary | ICD-10-CM | POA: Insufficient documentation

## 2020-09-29 LAB — CBC AND DIFFERENTIAL
Baso # K/uL: 0 10*3/uL (ref 0.0–0.1)
Basophil %: 0.8 %
Eos # K/uL: 0.2 10*3/uL (ref 0.0–0.4)
Eosinophil %: 3.2 %
Hematocrit: 44 % (ref 34–45)
Hemoglobin: 14.3 g/dL (ref 11.2–15.7)
IMM Granulocytes #: 0 10*3/uL (ref 0.0–0.0)
IMM Granulocytes: 0.2 %
Lymph # K/uL: 2.9 10*3/uL (ref 1.2–3.7)
Lymphocyte %: 57.9 %
MCH: 27 pg (ref 26–32)
MCHC: 32 g/dL (ref 32–36)
MCV: 85 fL (ref 79–95)
Mono # K/uL: 0.5 10*3/uL (ref 0.2–0.9)
Monocyte %: 10.3 %
Neut # K/uL: 1.4 10*3/uL — ABNORMAL LOW (ref 1.6–6.1)
Nucl RBC # K/uL: 0 10*3/uL (ref 0.0–0.0)
Nucl RBC %: 0 /100 WBC (ref 0.0–0.2)
Platelets: 263 10*3/uL (ref 160–370)
RBC: 5.2 MIL/uL (ref 3.9–5.2)
RDW: 13.7 % (ref 11.7–14.4)
Seg Neut %: 27.6 %
WBC: 5 10*3/uL (ref 4.0–10.0)

## 2020-09-29 LAB — COMPREHENSIVE METABOLIC PANEL
ALT: 9 U/L (ref 0–35)
AST: 16 U/L (ref 0–35)
Albumin: 4.1 g/dL (ref 3.5–5.2)
Alk Phos: 72 U/L (ref 35–105)
Anion Gap: 13 (ref 7–16)
Bilirubin,Total: 0.6 mg/dL (ref 0.0–1.2)
CO2: 24 mmol/L (ref 20–28)
Calcium: 9.8 mg/dL (ref 8.6–10.2)
Chloride: 103 mmol/L (ref 96–108)
Creatinine: 0.95 mg/dL (ref 0.51–0.95)
Glucose: 101 mg/dL — ABNORMAL HIGH (ref 60–99)
Lab: 10 mg/dL (ref 6–20)
Potassium: 4.9 mmol/L (ref 3.3–5.1)
Sodium: 140 mmol/L (ref 133–145)
Total Protein: 8.1 g/dL — ABNORMAL HIGH (ref 6.3–7.7)
eGFR BY CREAT: 67 *

## 2020-09-29 LAB — MICROALBUMIN, URINE, RANDOM
Creatinine,UR: 497 mg/dL — ABNORMAL HIGH (ref 20–300)
Microalb/Creat Ratio: 7.7 mg MA/g CR (ref 0.0–29.9)
Microalbumin,UR: 3.81 mg/dL

## 2020-09-29 LAB — LIPID PANEL
Chol/HDL Ratio: 5.9
Cholesterol: 212 mg/dL — AB
HDL: 36 mg/dL — ABNORMAL LOW (ref 40–60)
LDL Calculated: 158 mg/dL — AB
Non HDL Cholesterol: 176 mg/dL
Triglycerides: 92 mg/dL

## 2020-09-29 LAB — VITAMIN D: 25-OH Vit Total: 28 ng/mL — ABNORMAL LOW (ref 30–60)

## 2020-09-29 LAB — TSH: TSH: 1.2 u[IU]/mL (ref 0.27–4.20)

## 2020-09-29 IMAGING — MR MRI CERVICAL SPINE WITHOUT CONTRAST
8 series · 48 of 48 positions shown · non-contrast
Comparison: None available

FINAL REPORT:
HISTORY: Cervical radiculopathy
Procedure: MRI of the cervical spine without contrast
TECHNIQUE: Multisequence, multiplanar imaging of the cervical spine was performed.

[Series 1: survey · coronal · 1.7mm · 1.67mm/px · 19 of 96 slices shown]
[im 1/96]
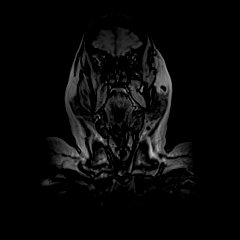
[im 6/96]
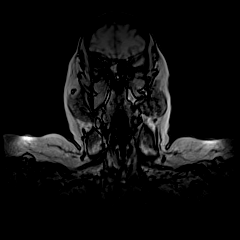
[im 11/96]
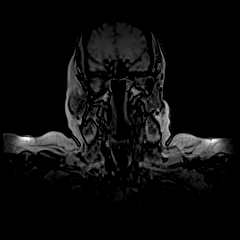
[im 16/96]
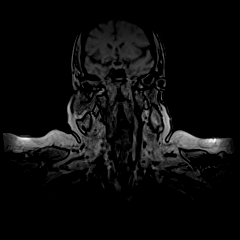
[im 22/96]
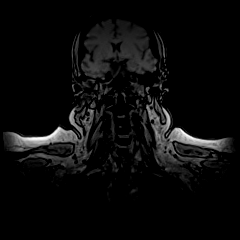
[im 27/96]
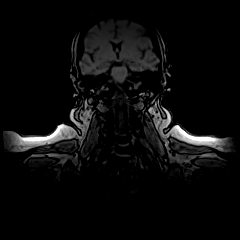
[im 32/96]
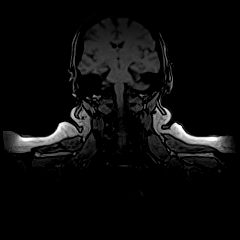
[im 37/96]
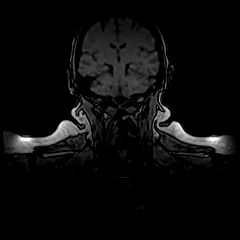
[im 43/96]
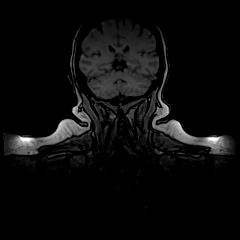
[im 48/96]
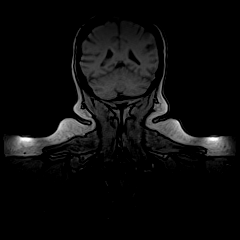
[im 53/96]
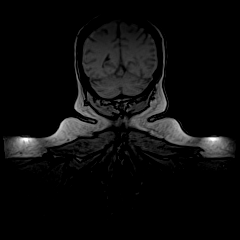
[im 59/96]
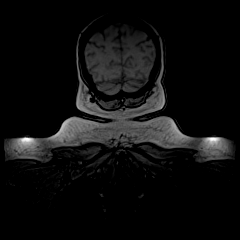
[im 64/96]
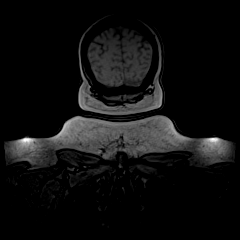
[im 69/96]
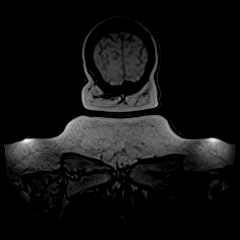
[im 74/96]
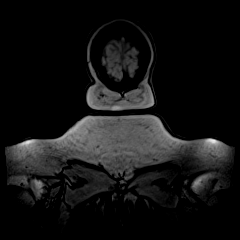
[im 80/96]
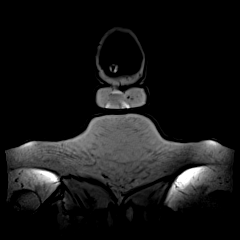
[im 85/96]
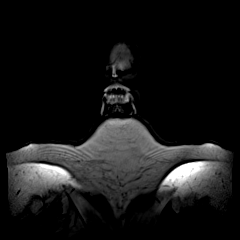
[im 90/96]
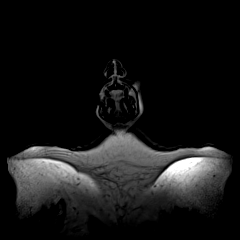
[im 96/96]
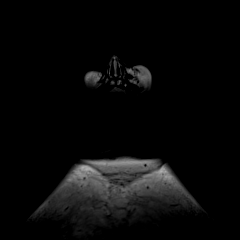

[Series 3: survey_mpr_sag · sagittal · 1.7mm · 1.67mm/px · 3 of 15 slices shown]
[im 1/15]
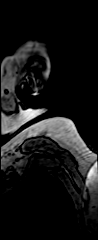
[im 8/15]
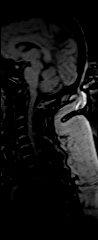
[im 15/15]
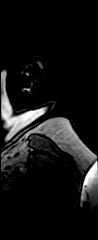

[Series 4: survey_mpr_(person_name) · axial · 1.7mm · 1.67mm/px · 1 of 7 slices shown]
[im 1/7]
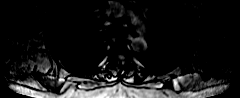

[Series 5: STIR · sagittal · 3.0mm · 0.86mm/px · 3 of 17 slices shown]
[im 1/17]
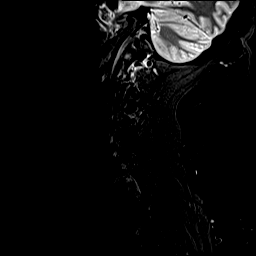
[im 9/17]
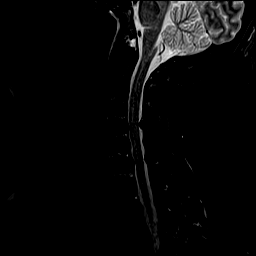
[im 17/17]
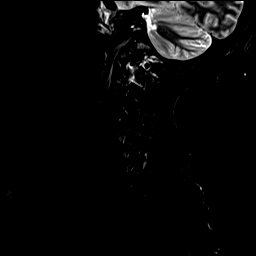

[Series 6: T2 · sagittal · 3.0mm · 0.57mm/px · 3 of 17 slices shown (1 of 2)]
[im 1/17]
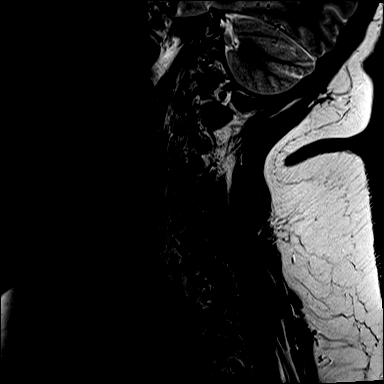
[im 9/17]
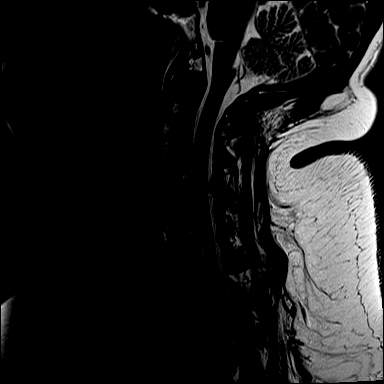
[im 17/17]
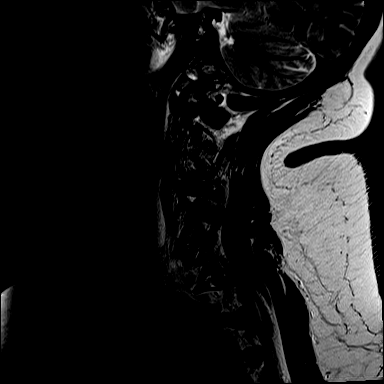

[Series 7: T1 · sagittal · 3.0mm · 0.69mm/px · 3 of 17 slices shown]
[im 1/17]
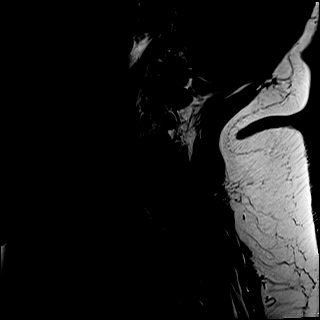
[im 9/17]
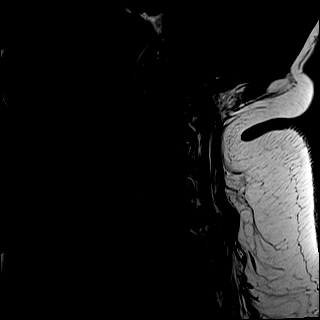
[im 17/17]
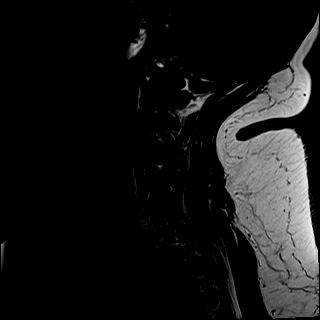

[Series 8: GRE · axial · 3.0mm · 0.35mm/px · z∈[-110,-18]mm · 6 of 30 slices shown]
[im 1/30]
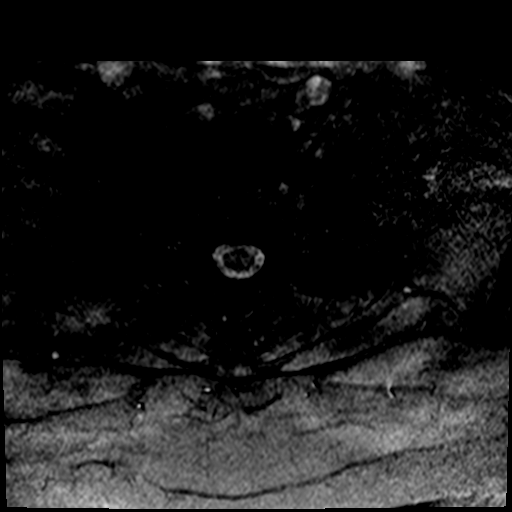
[im 6/30]
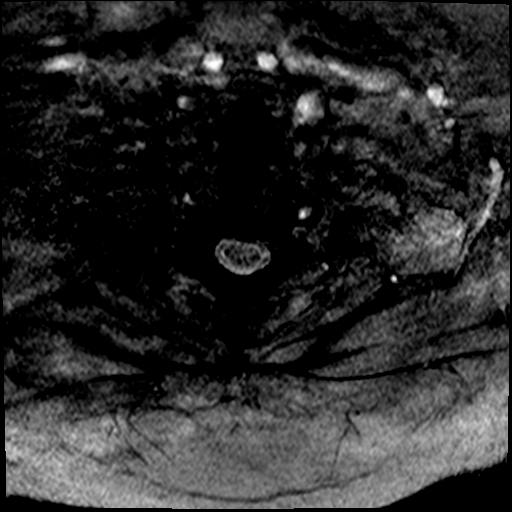
[im 12/30]
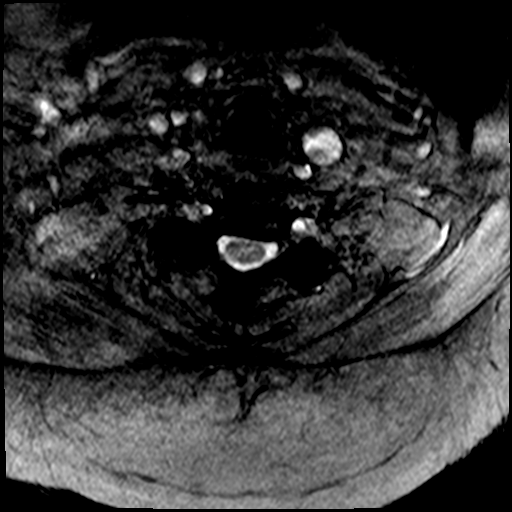
[im 18/30]
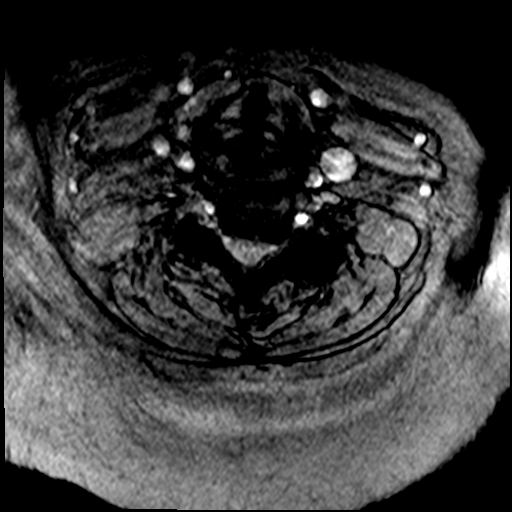
[im 24/30]
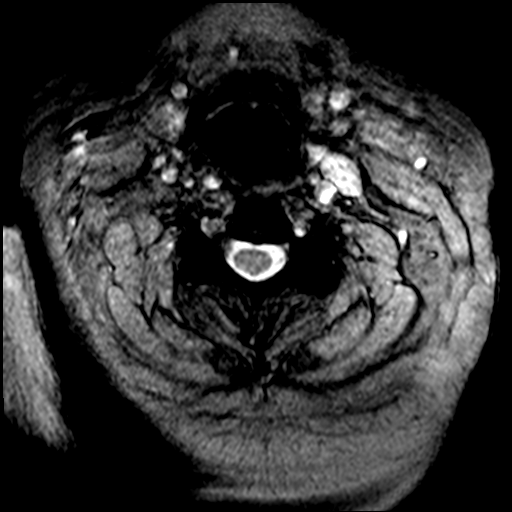
[im 30/30]
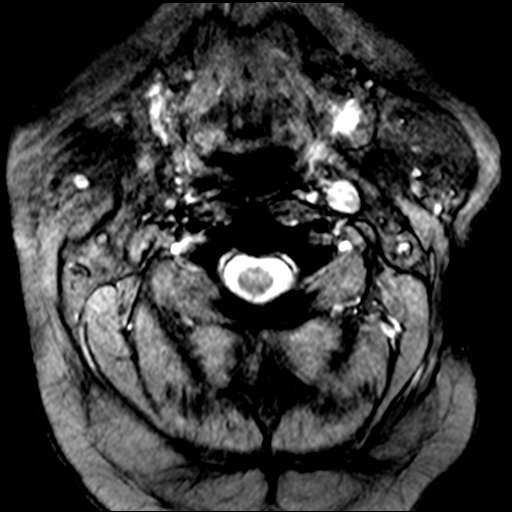

[Series 9: T2 · axial · 2.0mm · 0.70mm/px · z∈[-113,-15]mm · 10 of 52 slices shown (2 of 2)]
[im 1/52]
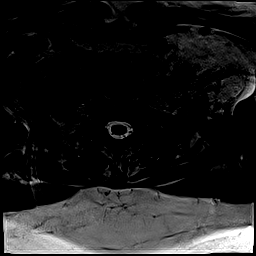
[im 6/52]
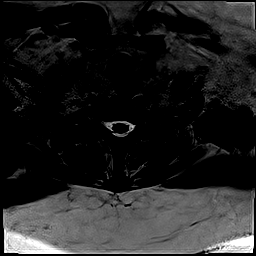
[im 12/52]
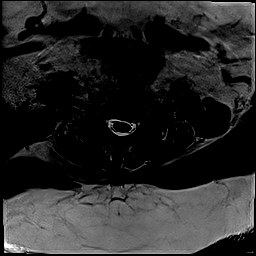
[im 18/52]
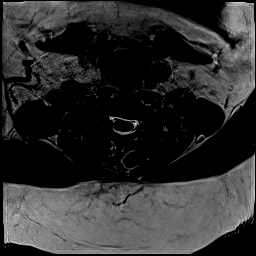
[im 23/52]
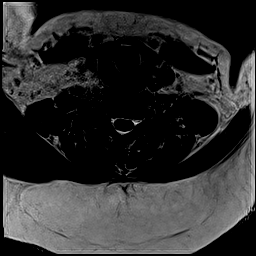
[im 29/52]
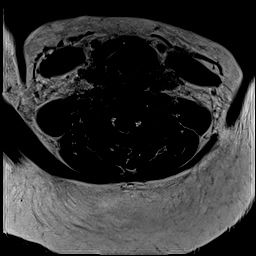
[im 35/52]
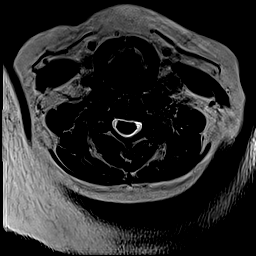
[im 40/52]
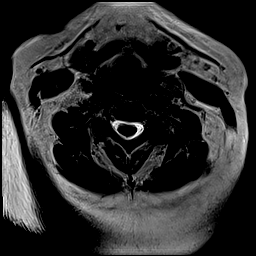
[im 46/52]
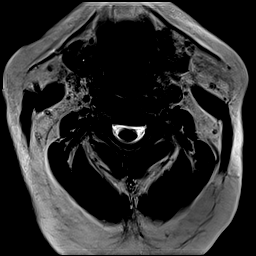
[im 52/52]
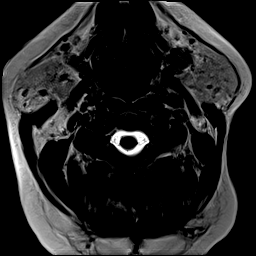

[48 of 48 positions shown; findings below may reference images not displayed]

FINDINGS: No aggressive marrow signal abnormality is seen. No destructive bone lesion. No marrow edema. Cerebellar tonsils are not ectopic. Cervical cord is normal in course, caliber, and signal intensity. The prevertebral and paraspinal soft tissues are unremarkable.
C2-3: No significant spinal stenosis or neural foraminal narrowing.
C3-4: Disc osteophyte complex with mild left neural foraminal narrowing. Facet arthropathy contributes. No spinal stenosis or right neural foraminal narrowing
C4-5: Broad-based disc osteophyte complex causing moderately severe far left lateral spinal stenosis. There is also high-grade left neural foraminal narrowing from facet arthropathy. Mild right neural foraminal narrowing
C5-6: Broad-based disc osteophyte complex with mild spinal stenosis and mild left neural foraminal narrowing. Facet arthropathy contributes
C6-7: Broad-based disc osteophyte complex with mild-to-moderate left lateral spinal stenosis. Moderate left neural foraminal narrowing.
C7-T1: Unremarkable
IMPRESSION: 
IMPRESSION: Degenerative changes with spinal stenosis and neural foraminal narrowing as described above level by level

## 2020-09-29 IMAGING — MR MRI LUMBAR SPINE WITHOUT CONTRAST
5 of 8 series · 29 of 48 positions shown · non-contrast
Comparison: None available

FINAL REPORT:
HISTORY: Lumbar radiculopathy
Procedure: MRI of the lumbar spine without contrast
TECHNIQUE: Multisequence, multiplanar imaging of the lumbar spine was performed.

[Series 6: T2 · sagittal · 4.0mm · 0.62mm/px · 5 of 17 slices shown (1 of 2)]
[im 1/17]
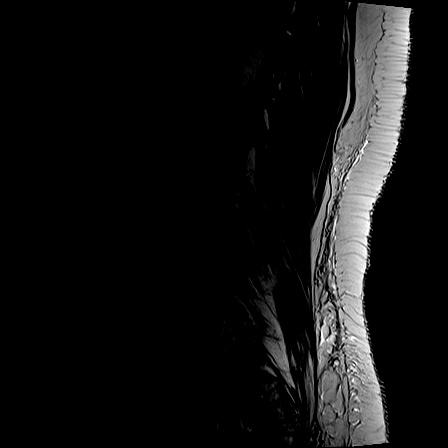
[im 5/17]
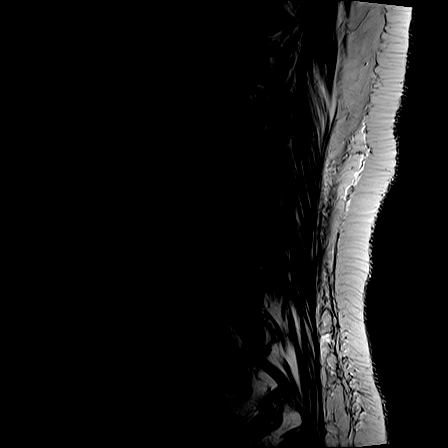
[im 9/17]
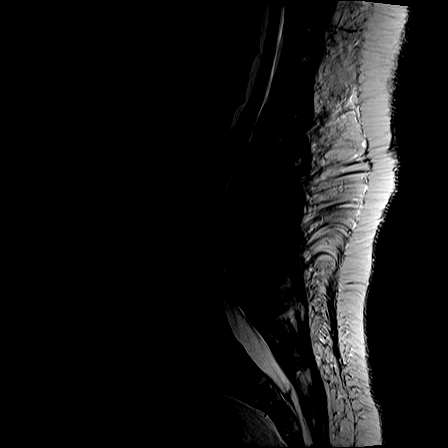
[im 13/17]
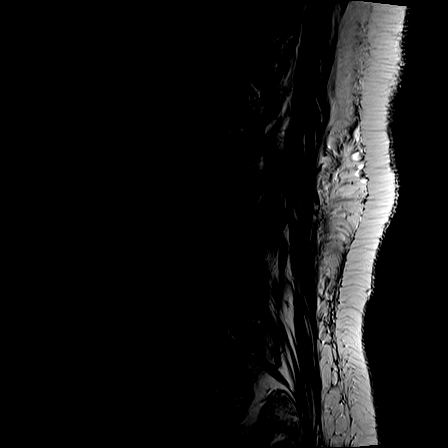
[im 17/17]
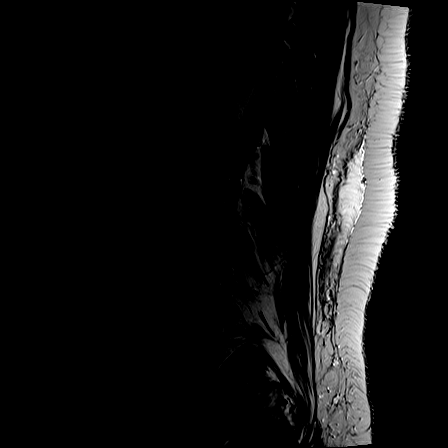

[Series 7: STIR · sagittal · 4.0mm · 0.73mm/px · 1 of 17 slices shown]
[im 1/17]
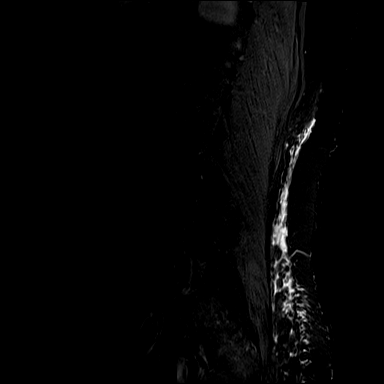

[Series 8: T1 · sagittal · 4.0mm · 0.62mm/px · 5 of 17 slices shown (1 of 2)]
[im 1/17]
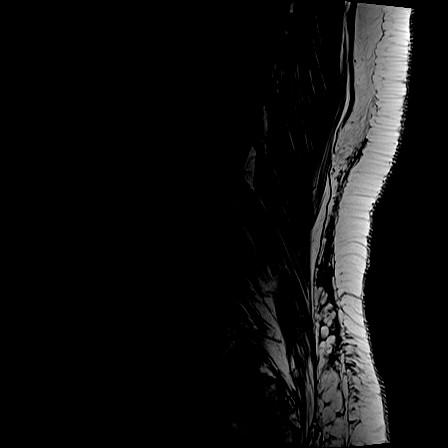
[im 5/17]
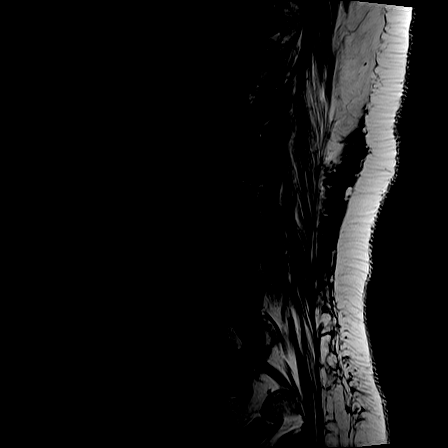
[im 9/17]
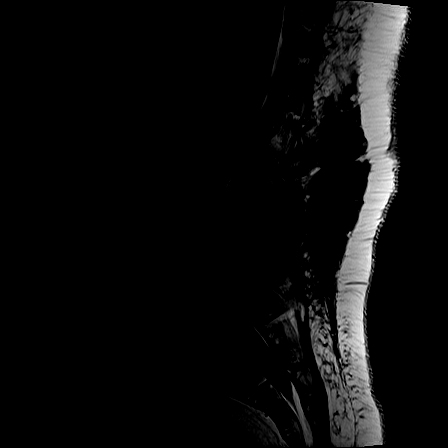
[im 13/17]
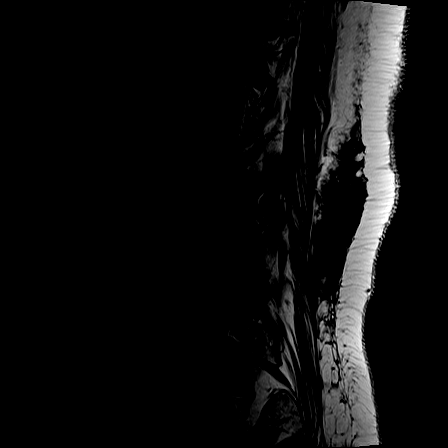
[im 17/17]
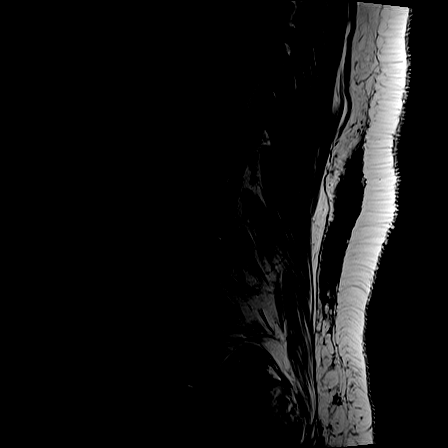

[Series 9: T2 · axial · 4.0mm · 0.49mm/px · z∈[-582,-338]mm · 9 of 39 slices shown (2 of 2)]
[im 1/39]
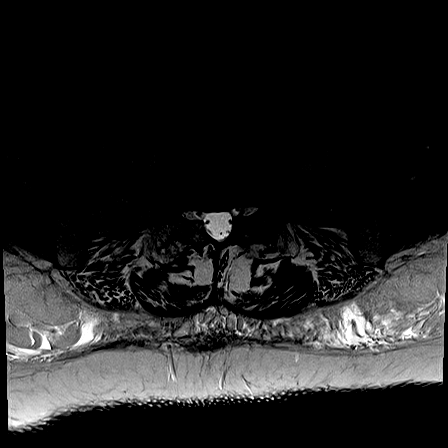
[im 7/39]
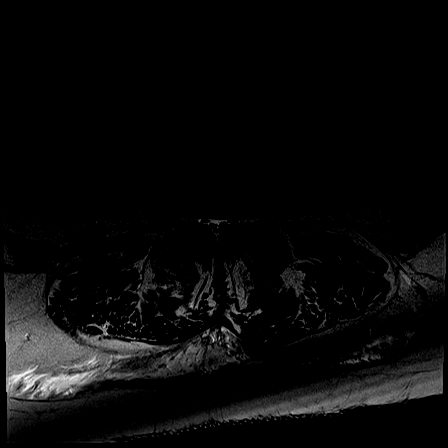
[im 11/39]
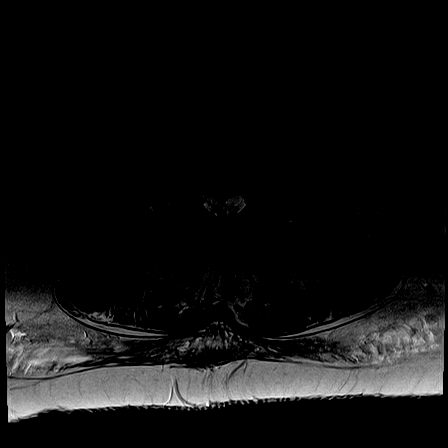
[im 18/39]
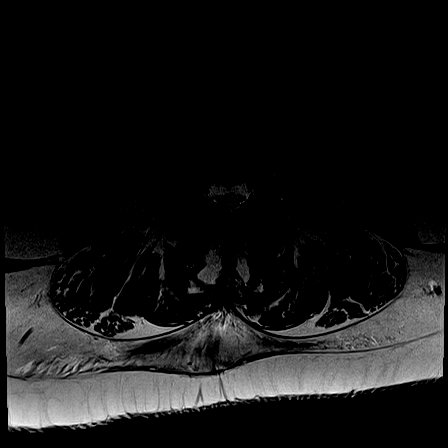
[im 21/39]
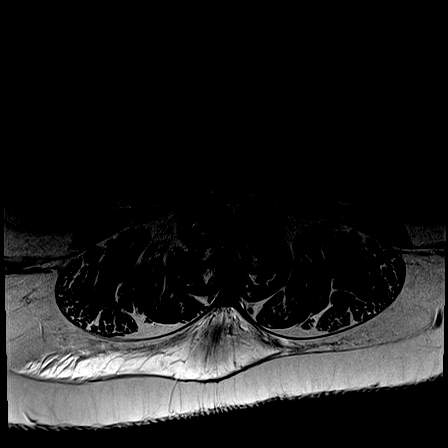
[im 28/39]
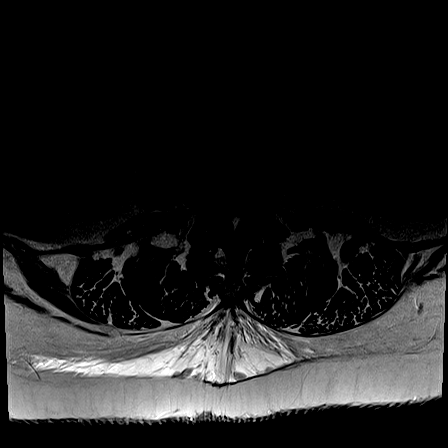
[im 32/39]
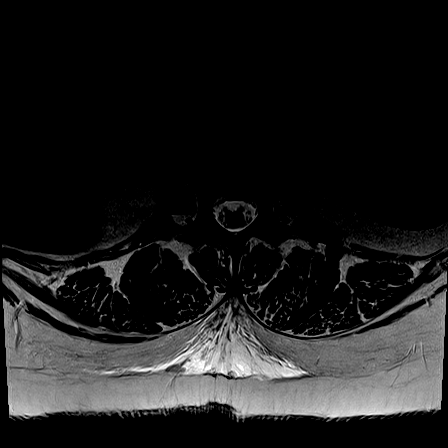
[im 35/39]
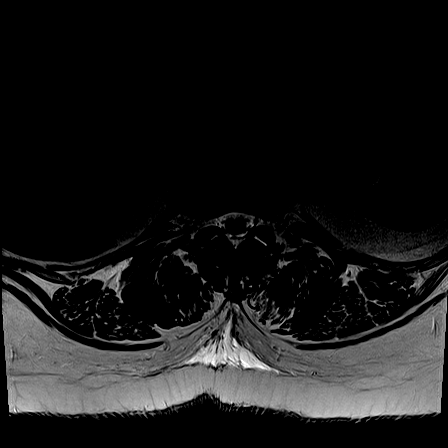
[im 39/39]
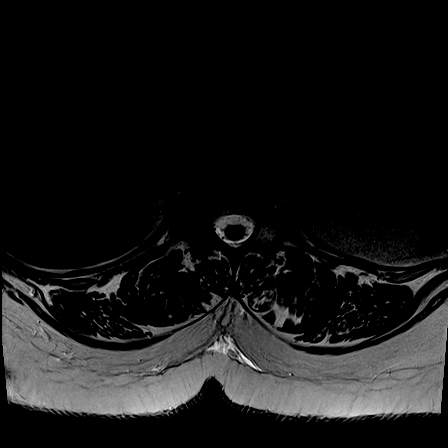

[Series 10: T1 · axial · 4.0mm · 0.57mm/px · z∈[-582,-338]mm · 9 of 39 slices shown (2 of 2)]
[im 1/39]
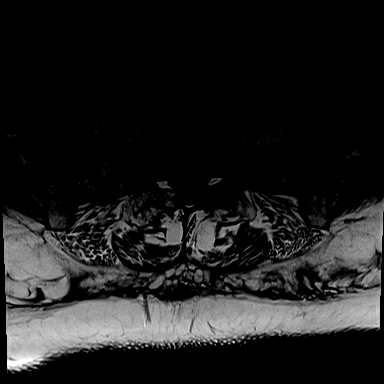
[im 7/39]
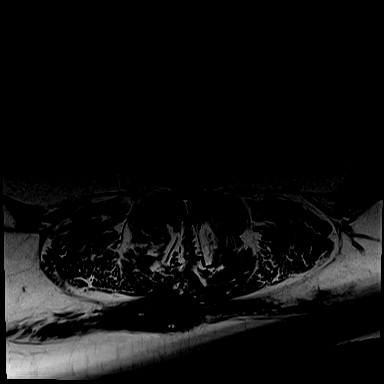
[im 11/39]
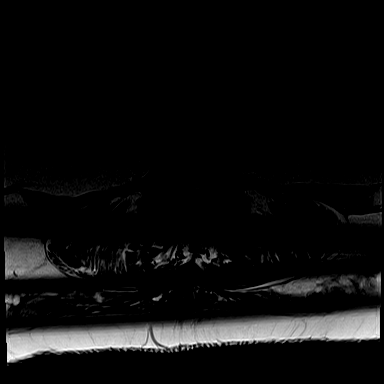
[im 18/39]
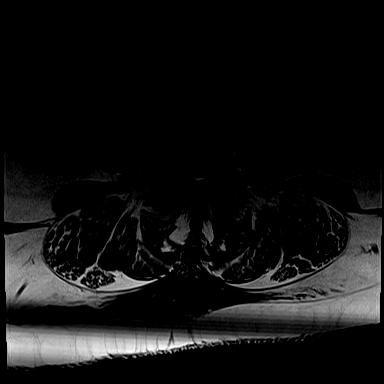
[im 21/39]
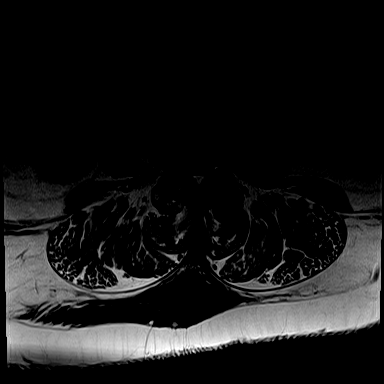
[im 28/39]
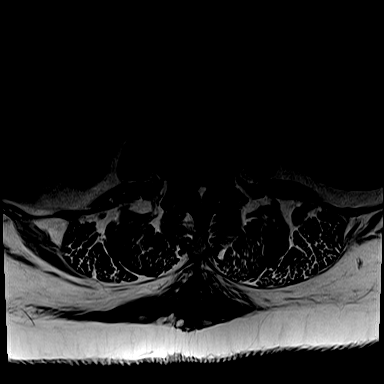
[im 32/39]
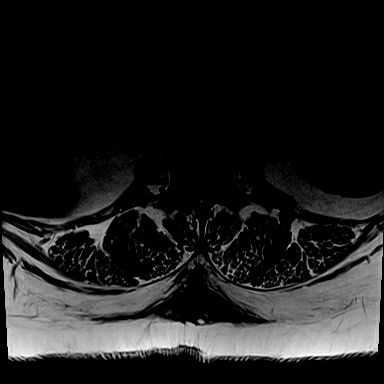
[im 35/39]
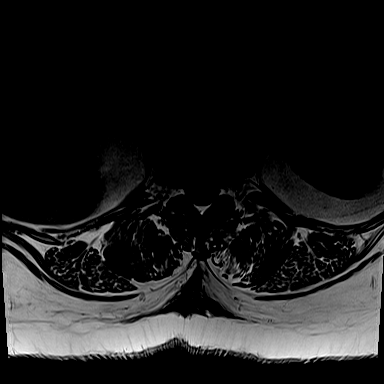
[im 39/39]
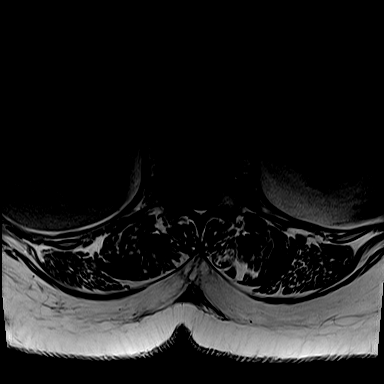

[29 of 48 positions shown; findings below may reference images not displayed]

FINDINGS: No aggressive marrow signal abnormality is seen. The conus medullaris ends at L1 and is normal in appearance. The prevertebral and paraspinal soft tissues are unremarkable.
T12-L1: Disc bulge with no spinal stenosis or neural foraminal narrowing
L1-L2: Posterior element hypertrophy and disc bulge with moderate spinal stenosis. There is moderate bilateral neural foraminal narrowing.
L2-3: Posterior element hypertrophy and disc bulge with moderate spinal stenosis and mild-to-moderate bilateral neural foraminal narrowing.
L3-L4: Posterior element hypertrophy and disc bulge with moderately severe spinal stenosis. Mild right and moderate left neural foraminal narrowing
L4-5: Posterior element hypertrophy with moderately severe spinal stenosis. Mild bilateral neural foraminal narrowing.
L5-S1: Posterior element hypertrophy and broad-based protrusion with mild spinal stenosis and mild neural foraminal narrowing
IMPRESSION: 
IMPRESSION: Degenerative changes with spinal stenosis and neural foraminal narrowing as described above level by level

## 2020-09-30 LAB — HEMOGLOBIN A1C: Hemoglobin A1C: 6.1 % — ABNORMAL HIGH

## 2020-10-08 ENCOUNTER — Encounter: Payer: Self-pay | Admitting: Primary Care

## 2021-01-28 IMAGING — MG MAMMO BREAST SCREENING BILATERAL
8 of 12 series · 8 of 12 positions shown · non-contrast
Comparison: 09/30/2016.

This is a summary report. The complete report is available in the patient's medical record. If you cannot access the medical record, please contact the sending organization for a detailed fax or copy.
Pt seated in w/c  for screening mammo.  LD
FINAL REPORT:
HISTORY: 65 years year old Female Encounter for screening mammogram for malignant neoplasm of breast
EXAM: MAMMO BREAST SCREENING BILATERAL

[R XCCL]
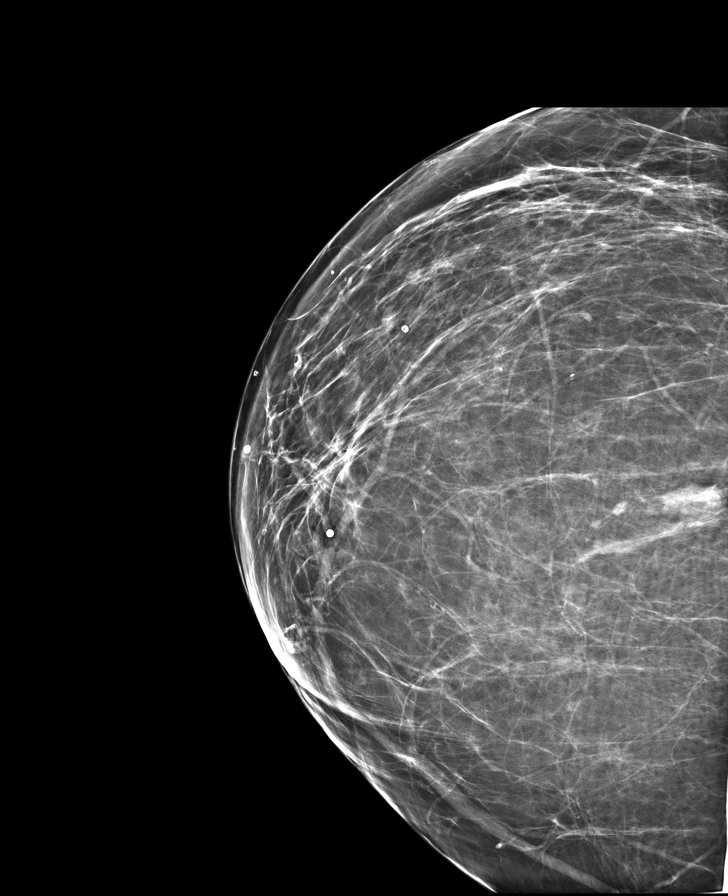

[R MLO (1 of 2)]
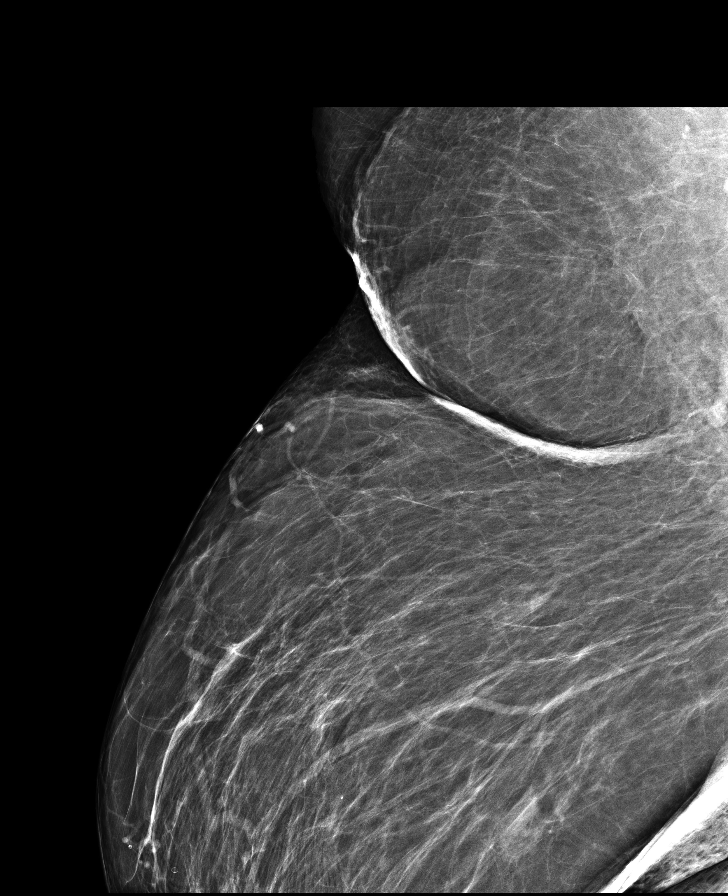

[R MLO (2 of 2)]
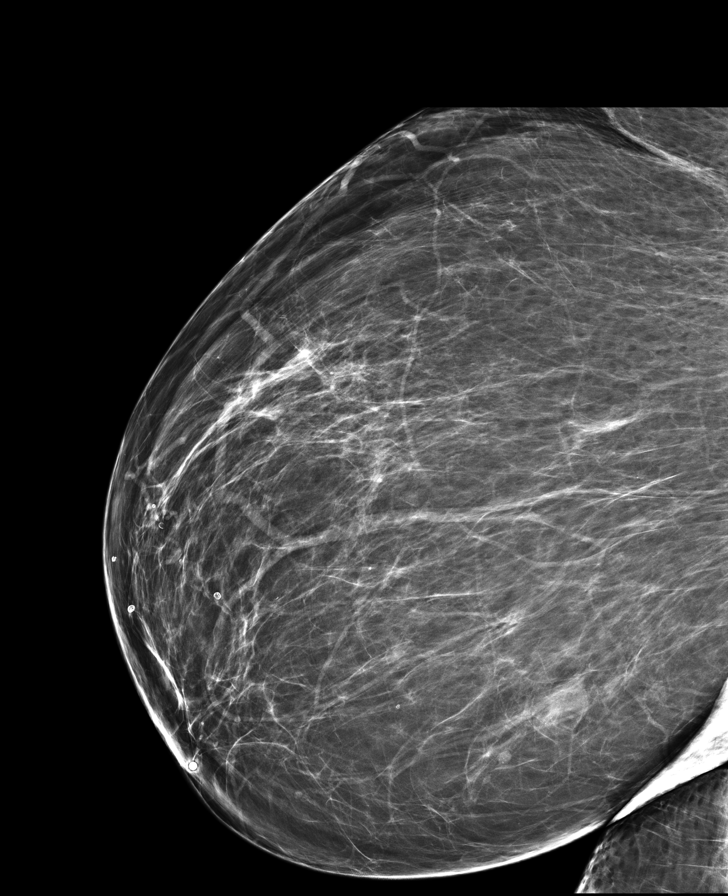

[L CC (1 of 2)]
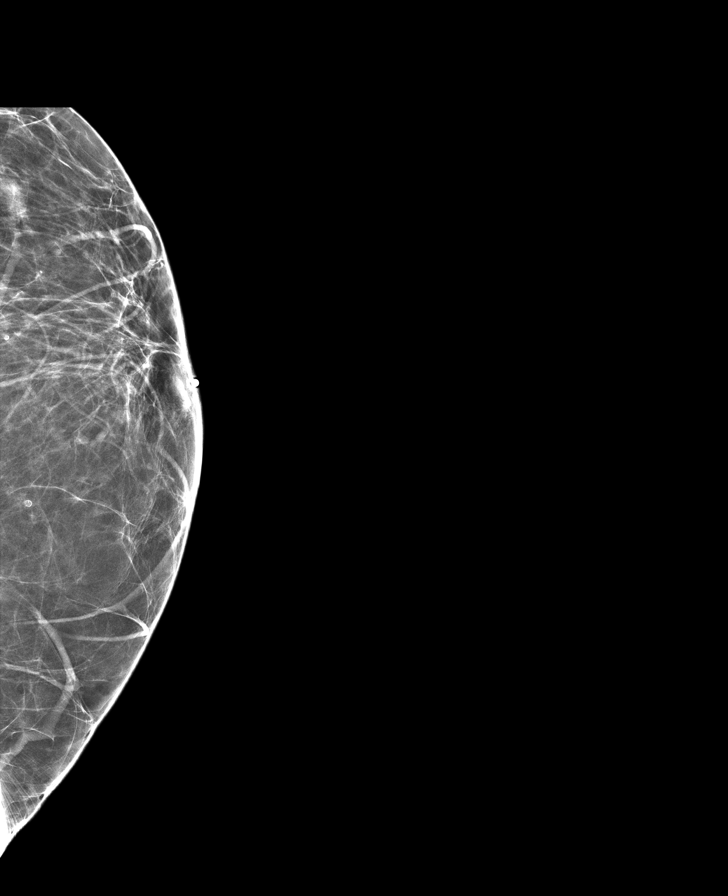

[L CC (2 of 2)]
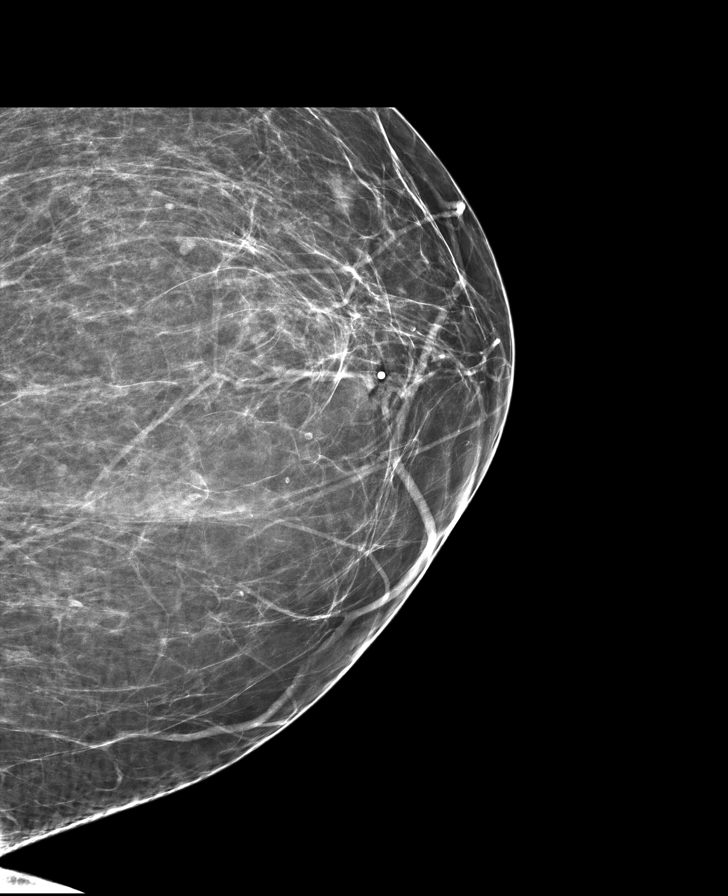

[L MLO]
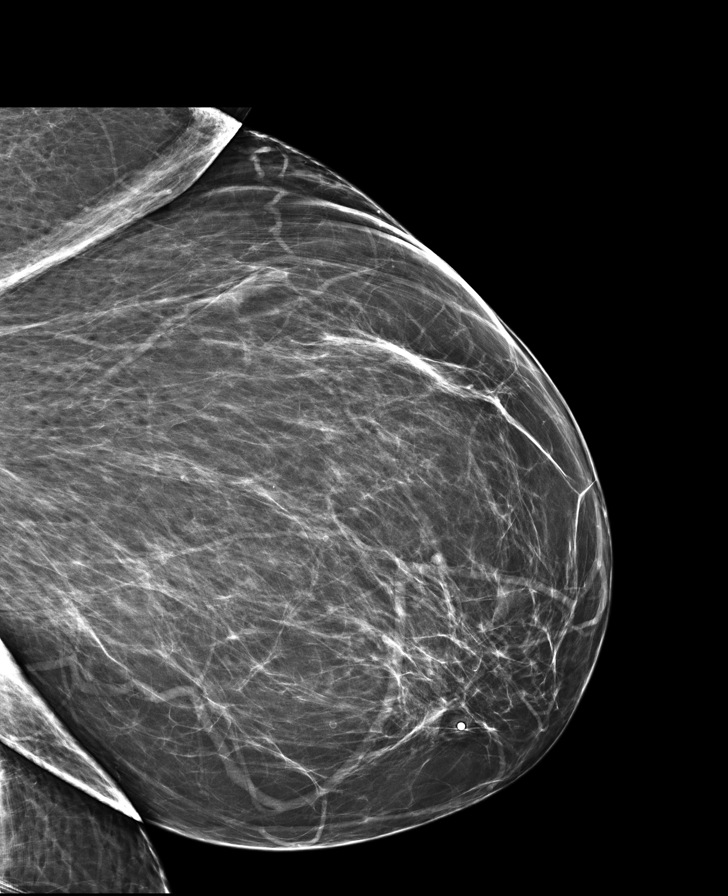

[R CC]
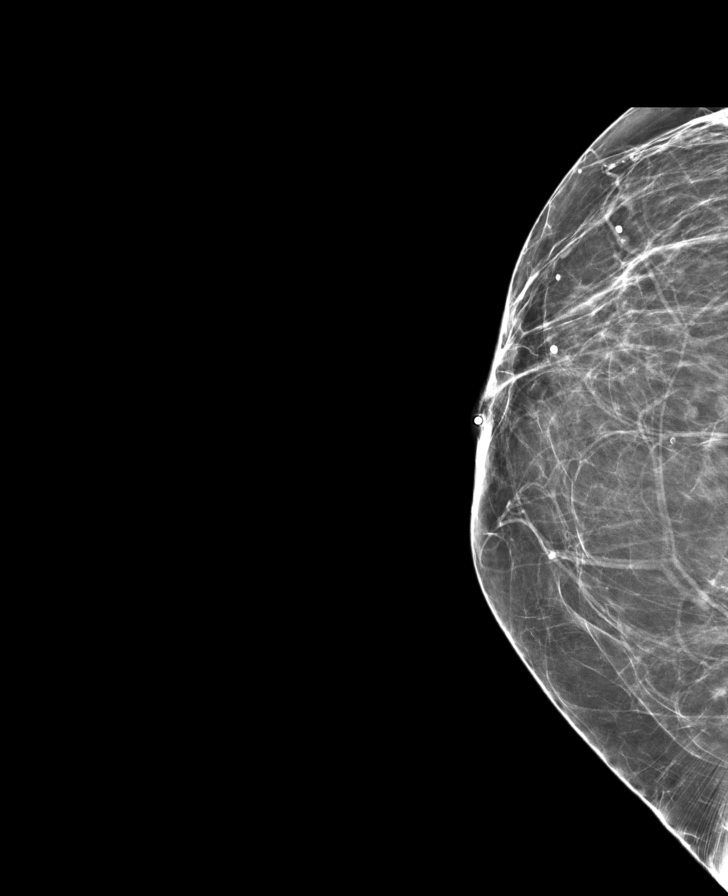

[L XCCL]
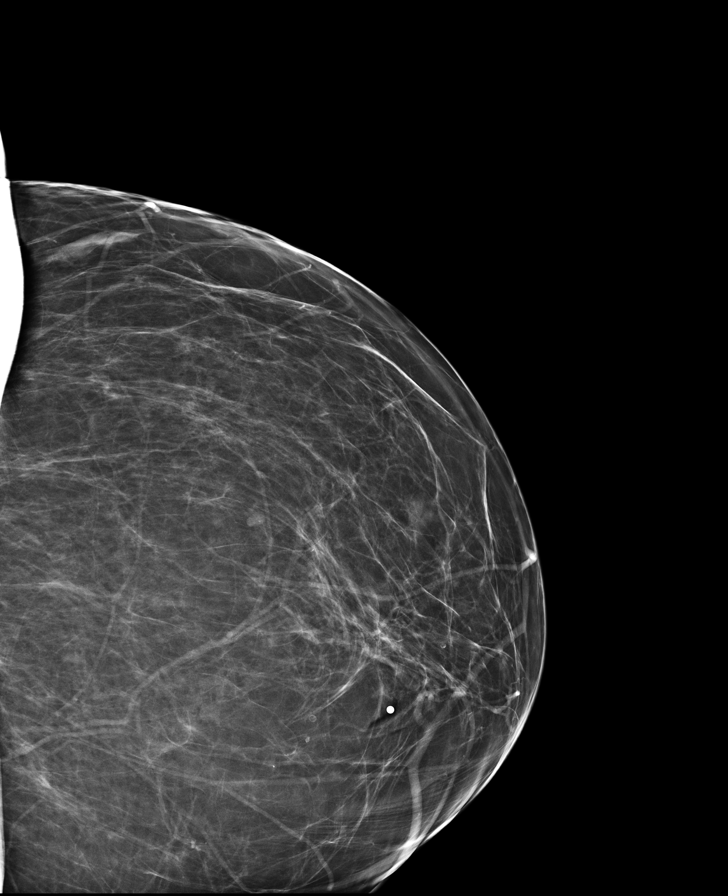

[8 of 12 positions shown; findings below may reference images not displayed]

CAD: CAD was utilized for this examination.
Breast Density: (B) Scattered fibroglandular elements
FINDINGS: Asymmetric densities within the right breast are stable. Benign coarse calcifications are seen bilaterally.
No suspicious masses, architectural distortion, or suspicious calcifications.
IMPRESSION: 1. BI-RADS 2, benign findings.
2. Recommend routine annual screening mammogram.
The patient will be entered into a reminder system with a target due date for the next mammogram.

## 2021-08-03 ENCOUNTER — Encounter: Payer: Self-pay | Admitting: Primary Care

## 2021-08-12 ENCOUNTER — Ambulatory Visit: Payer: PRIVATE HEALTH INSURANCE | Admitting: Primary Care

## 2021-08-12 ENCOUNTER — Encounter: Payer: Self-pay | Admitting: Primary Care

## 2021-08-23 ENCOUNTER — Encounter: Payer: Self-pay | Admitting: Primary Care

## 2021-08-26 ENCOUNTER — Ambulatory Visit
Admission: RE | Admit: 2021-08-26 | Discharge: 2021-08-26 | Disposition: A | Discharge status: Home / Self Care | Payer: PRIVATE HEALTH INSURANCE | Source: Ambulatory Visit | Attending: Primary Care | Admitting: Primary Care

## 2021-08-26 ENCOUNTER — Encounter: Payer: Self-pay | Admitting: Gastroenterology

## 2021-08-26 ENCOUNTER — Ambulatory Visit: Payer: PRIVATE HEALTH INSURANCE | Admitting: Primary Care

## 2021-08-26 ENCOUNTER — Other Ambulatory Visit: Payer: Self-pay

## 2021-08-26 VITALS — BP 122/76 | HR 82 | Temp 96.3°F | Wt 206.0 lb

## 2021-08-26 DIAGNOSIS — Z299 Encounter for prophylactic measures, unspecified: Secondary | ICD-10-CM

## 2021-08-26 DIAGNOSIS — M25551 Pain in right hip: Secondary | ICD-10-CM

## 2021-08-26 DIAGNOSIS — R7303 Prediabetes: Secondary | ICD-10-CM

## 2021-08-26 DIAGNOSIS — E785 Hyperlipidemia, unspecified: Secondary | ICD-10-CM

## 2021-08-26 DIAGNOSIS — Z029 Encounter for administrative examinations, unspecified: Secondary | ICD-10-CM

## 2021-08-26 DIAGNOSIS — Z1211 Encounter for screening for malignant neoplasm of colon: Secondary | ICD-10-CM

## 2021-08-26 NOTE — Progress Notes (Signed)
[facility]  Outpatient Visit Note    HPI: Pamela Mccoy is a 66 y.o. female who is here for evaluation.     Needs work form completed. 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   Also has a client staying in her home through the [facility].   She does not think she needs a PPD placed.     She also complains of right hip pain, intermittent, for months. Worsening. Worse with stair climbing. No injury or falls.   Xray in 2019 revealed arthritic changes.     Hyperlipidemia, lipids 09/2020.     Prediabetes, a1c 6.16 September 2020, no polydipsia    PMH: History reviewed with patient. Details include   Past Medical History:   Diagnosis Date   ? Hepatitis B    ? Hyperlipidemia        Past Surgical History:   Procedure Laterality Date   ? CESAREAN SECTION, CLASSIC      x 1   ? CESAREAN SECTION, CLASSIC      Cesarean Section Conversion Data        Family history:  Family History   Problem Relation Age of Onset   ? Cancer Mother    ? Cancer Father    ? Breast cancer Maternal Aunt      Social history:  Social History     Tobacco Use   ? Smoking status: Never   ? Smokeless tobacco: Never   Substance Use Topics   ? Alcohol use: Yes     Comment: 1/week       Meds:  Reviewed with patient.      Current Outpatient Medications:   ?  fluticasone (FLONASE) 50 MCG/ACT nasal spray, Spray 1 spray into nostril daily, Disp: , Rfl:    Medication list was reviewed and reconciled, and any changes in medications made during today's visit have been discussed with the patient.    Allergies:   Allergies   Allergen Reactions   ? Honey Anaphylaxis   ? Mango Anaphylaxis   ? Sunflower Oil Anaphylaxis       Review of Systems:   General ROS: negative for - chills or fever  Respiratory ROS: no cough, shortness of breath, or wheezing  Cardiovascular ROS: no chest pain or dyspnea on exertion  Gastrointestinal ROS: no abdominal pain, change in bowel habits, or black or bloody stools  Neurological ROS: no dizziness      Physical Exam:  Vitals:   Vitals:    08/26/21 0909   BP: 122/76   Pulse: 82   Temp: 35.7 ?C (96.3 ?F)   SpO2: 97%   Weight: 93.4 kg (206 lb)                                 Body mass index is 34.28 kg/m?Marland Kitchen  BP Readings from Last 4 Encounters:   08/26/21 122/76   02/24/19 118/78   12/17/17 126/80   07/17/16 120/64      Wt Readings from Last 4 Encounters:   08/26/21 93.4 kg (206 lb)   02/24/19 93 kg (205 lb)   12/17/17 96.2 kg (212 lb)   07/17/16 96.2 kg (212 lb)         General- In NAD  HEENT-  Eyes anicteric. EOMI   Neck-supple.  No thyromegaly or  visible or palpated masses  Lymph- no cervical  or supraclavicular lymphadenopathy.  Cardiovascular- rate and rhythm regular, normal S1, S2. Carotid pulses 2+, no bruits.   Respiratory- non labored breathing, effort normal, clear to auscultation bilaterally  GI- abdomen soft, nontender, nondistended, no hepatosplenomegaly or masses  Musk: range of motion R hip normal.   Back-no spinal tenderness.  Extr:  No C/C/E. Distal pulses 2+  Neurologic- CN intact, alert and oriented x 3. No focal neurologic deficits evident.      Lab Results   Component Value Date    HA1C 6.1 (H) 09/29/2020         Lab results: 09/29/20  1120   Sodium 140   Potassium 4.9   Chloride 103   CO2 24   UN 10   Creatinine 0.95   Glucose 101*   Calcium 9.8   Total Protein 8.1*   Albumin 4.1   ALT 9   AST 16   Alk Phos 72   Bilirubin,Total 0.6         Lab results: 09/29/20  1120   Cholesterol 212*   HDL 36*   LDL Calculated 158*   Triglycerides 92   Non HDL Cholesterol 176   Chol/HDL Ratio 5.9         Lab results: 09/29/20  1120   WBC 5.0   Hemoglobin 14.3   Hematocrit 44   RBC 5.2   Platelets 263       Assessment/Plan:     1. Right hip pain    2. Screening for colon cancer    3. Administrative encounter    4. Preventive measure    5. Hyperlipidemia, unspecified hyperlipidemia type    6. Prediabetes      1. Recommend re-imaging of right hip to assess if arthritis has progressed.   May need PT.     2.  Needs colon cancer screening, she is agreeable to colonoscopy, referral done.     3. Paperwork for work completed.     4. Advised to schedule pap smear, colonoscopy.   Immunizations recommended: Shingrix,    5. Due for repeat lipids, healthy diet, exercise as tolerated advised    6. To repeat lab work.     Sian Joles Laban Emperor, MD

## 2021-08-26 NOTE — Patient Instructions (Addendum)
Get xray of right hip    Strive to maintain a healthy balanced diet.   Regular exercise is recommended as tolerated  Regular mammograms, pap smears are recommended.

## 2021-08-28 ENCOUNTER — Encounter: Payer: Self-pay | Admitting: Primary Care

## 2021-08-30 IMAGING — CR XR HIP 2 OR 3 VW LEFT
2 series · 2 of 2 positions shown · non-contrast
Comparison: None

LT HIP PAIN POST FALL
FINAL REPORT:
Left hip 2 views
HISTORY: Pain. Low back pain, unspecified

[AP]
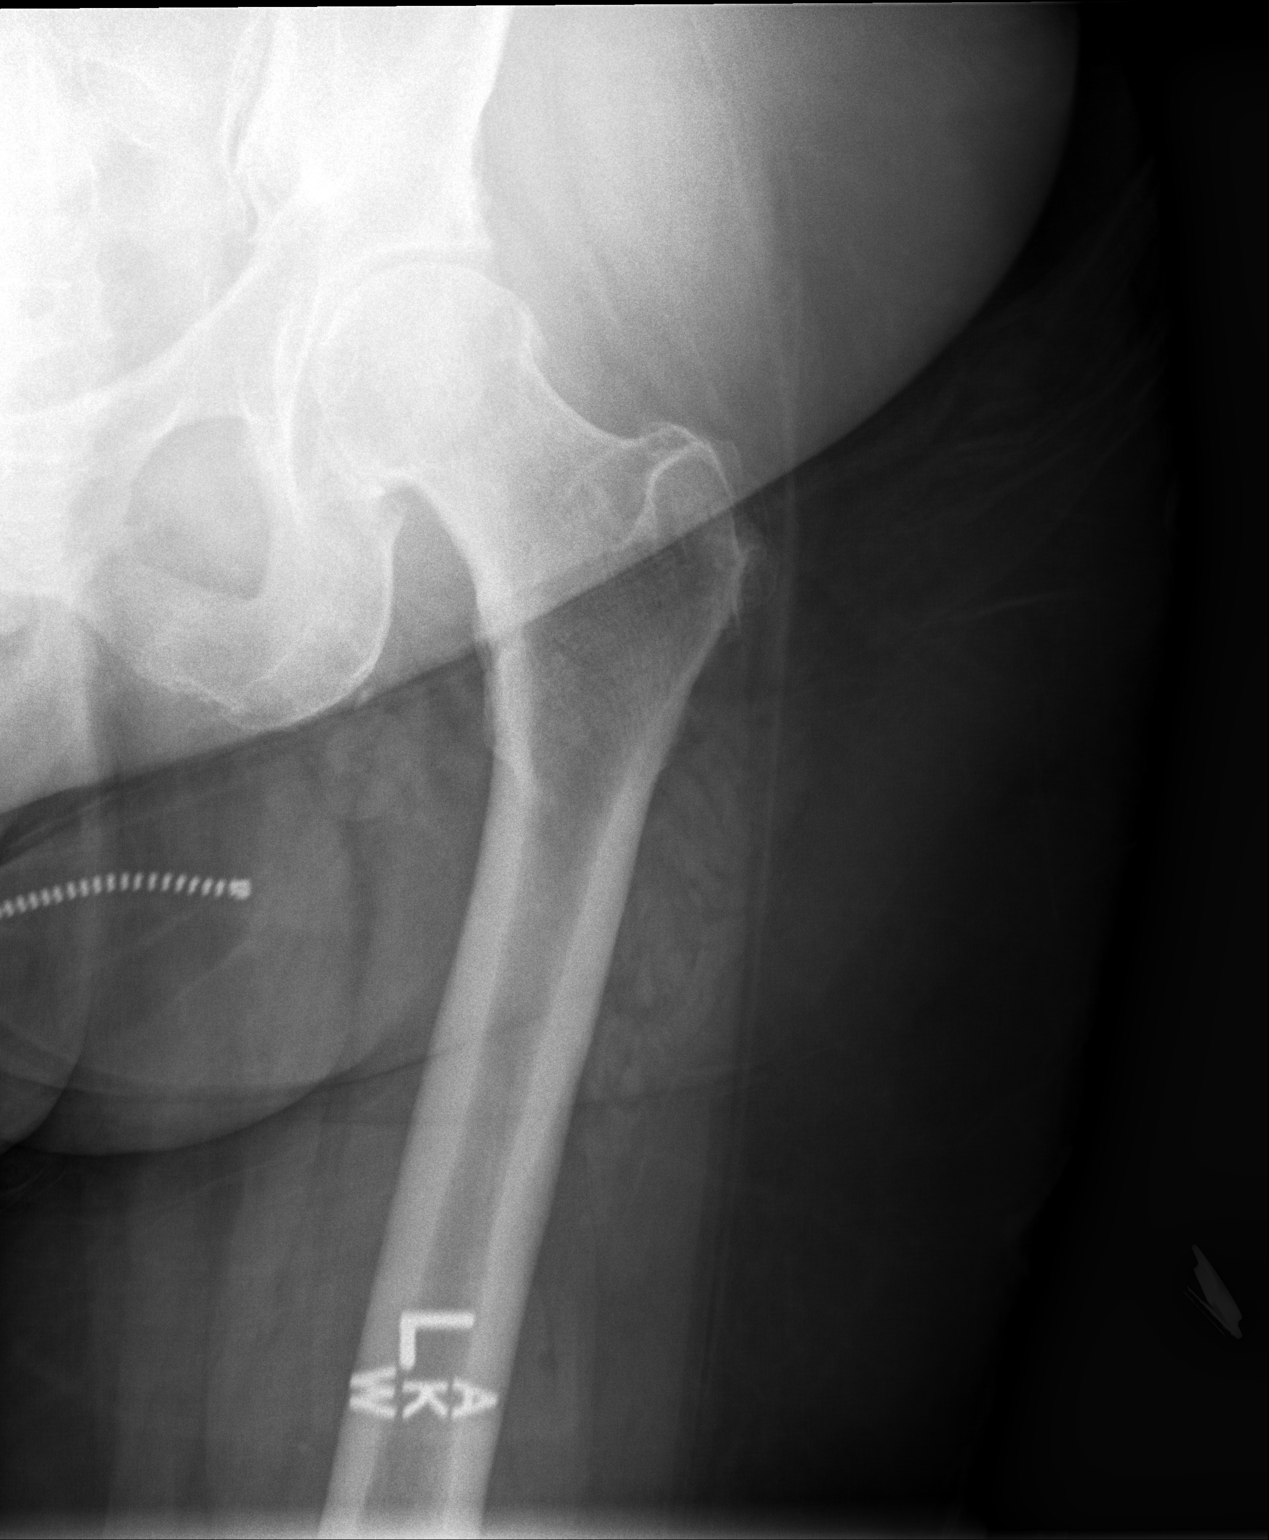

[left lateral]
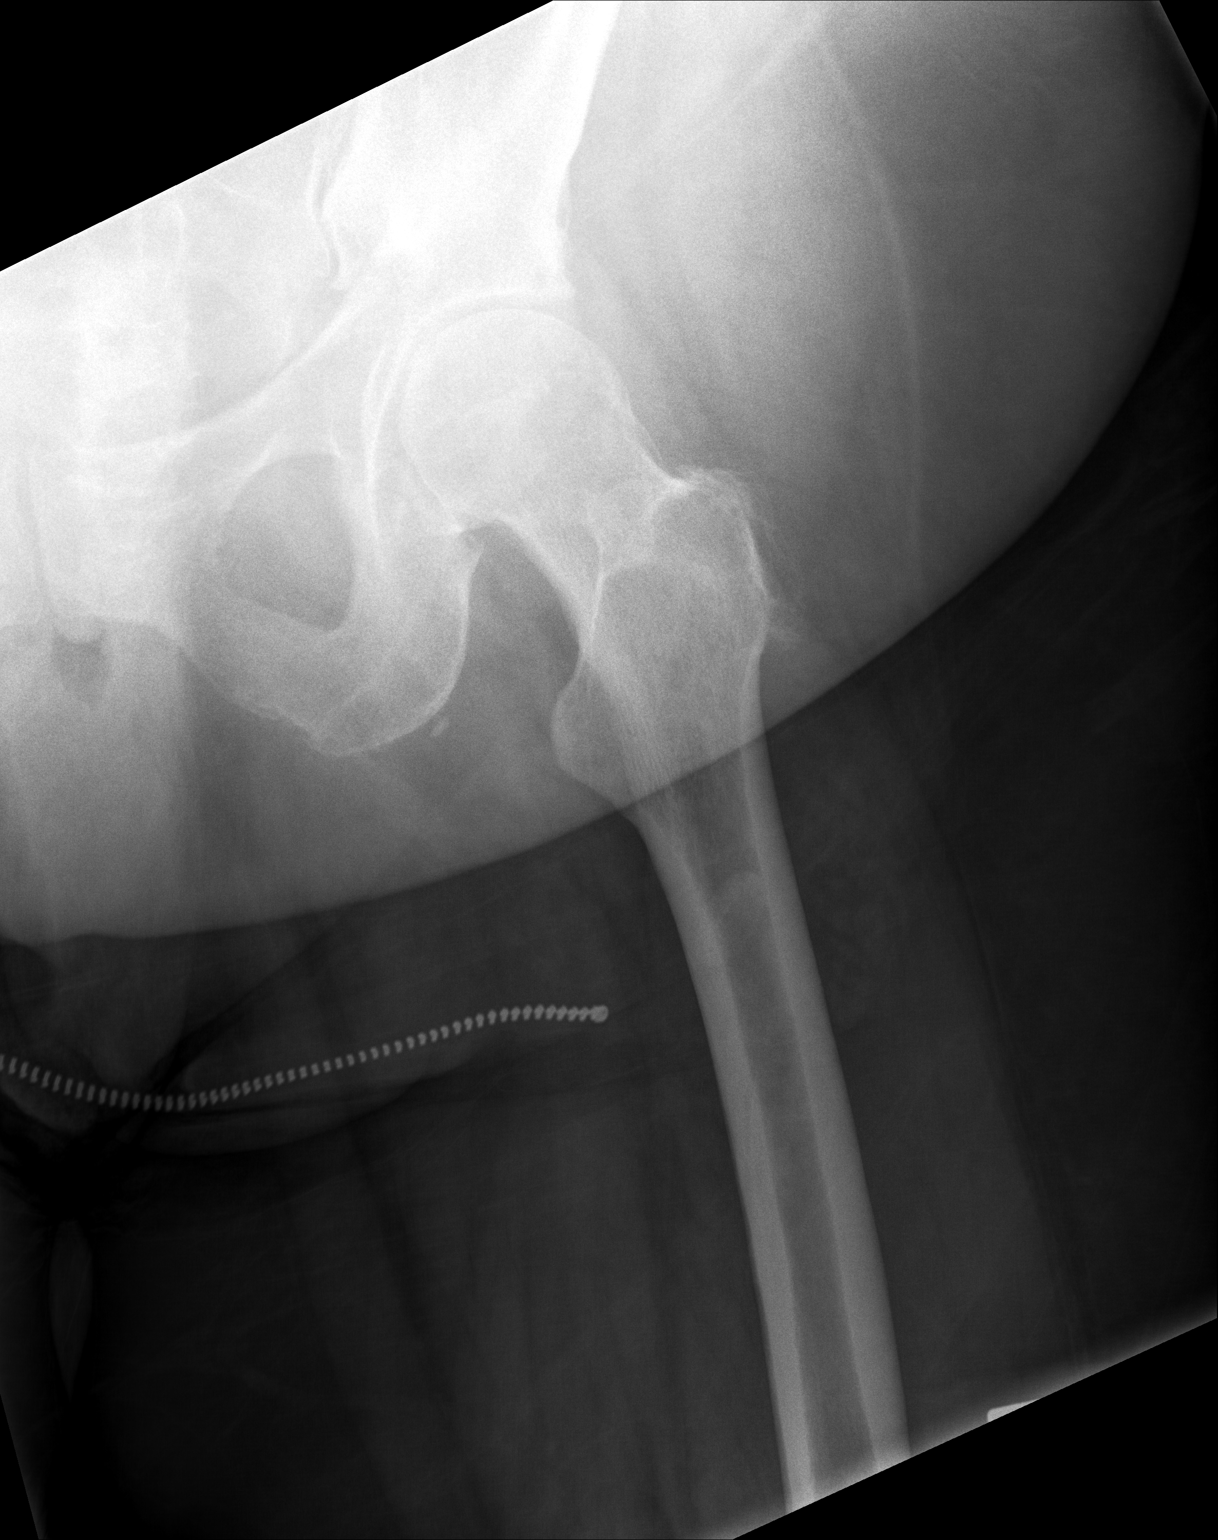

[2 of 2 positions shown; findings below may reference images not displayed]

FINDINGS: No acute fracture or subluxation of the left hip. Moderate joint space loss with subchondral sclerosis and marginal osteophyte formation. The soft tissues are unremarkable.
IMPRESSION: No acute fracture or subluxation. If pain persists recommend more sensitive evaluation with CT or MRI. Moderate degenerative changes of the left hip.

## 2021-08-30 IMAGING — CR XR LUMBAR SPINE 2-3 VIEWS
3 series · 3 of 3 positions shown · non-contrast
Comparison: 07/08/2016

LBP POST FALL
FINAL REPORT:
Lumbar spine radiograph 3 views
INDICATION: Pain Pain in left hip

[AP]
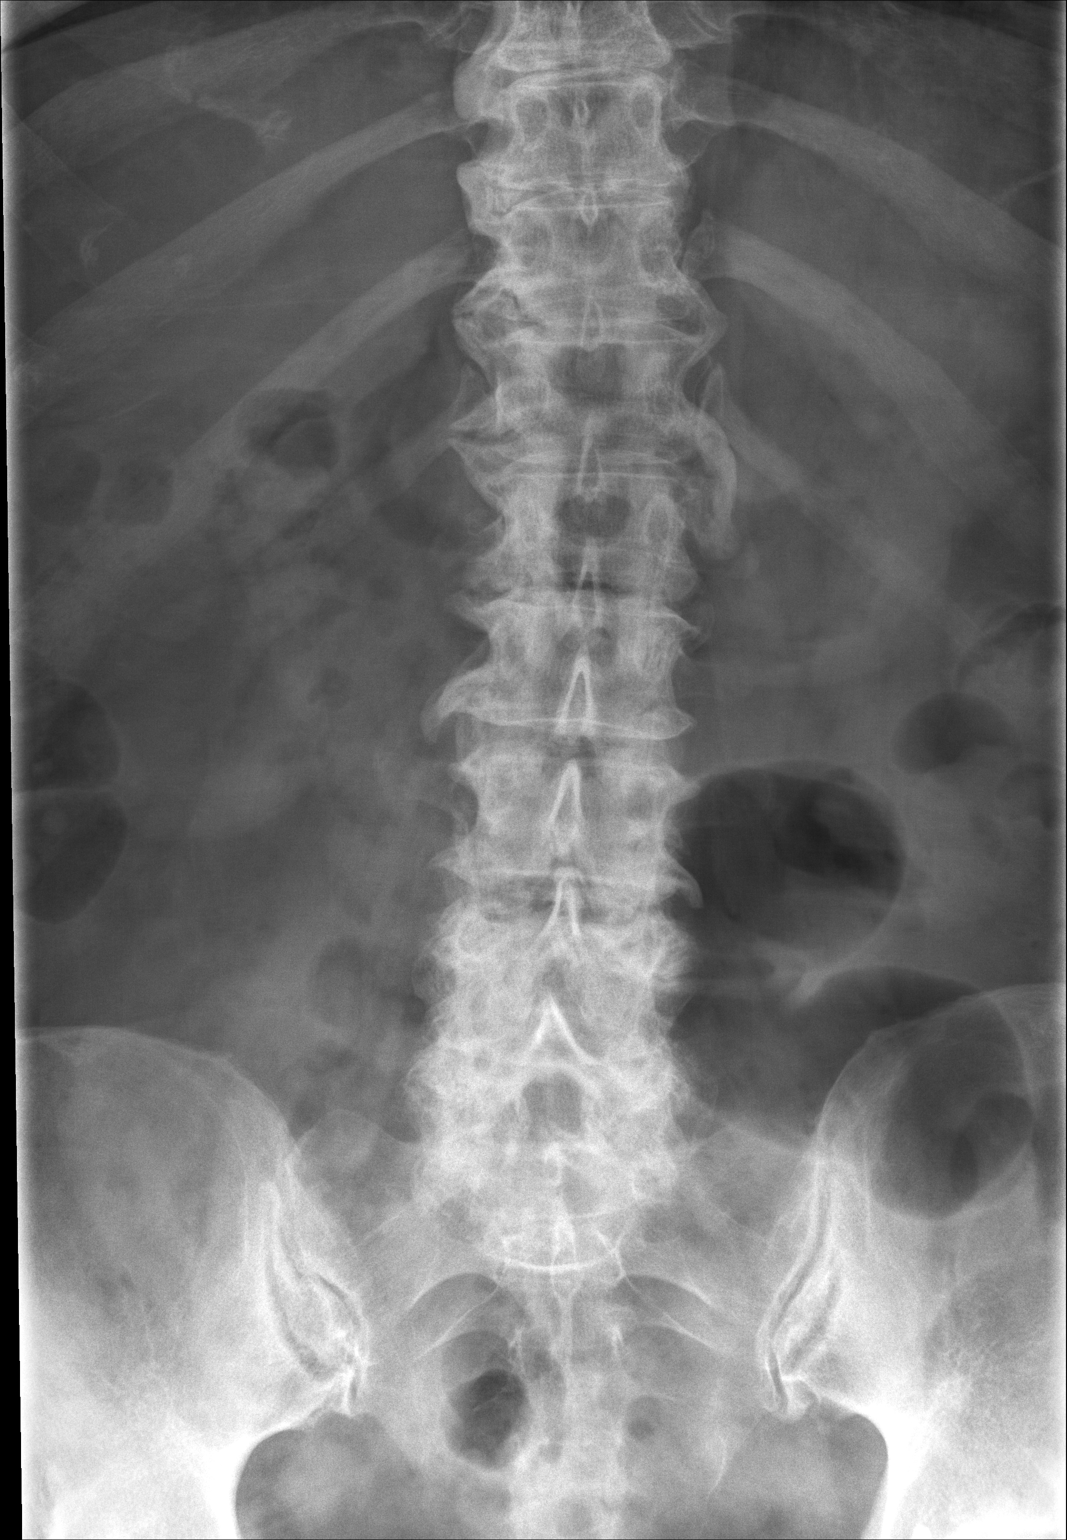

[lateral]
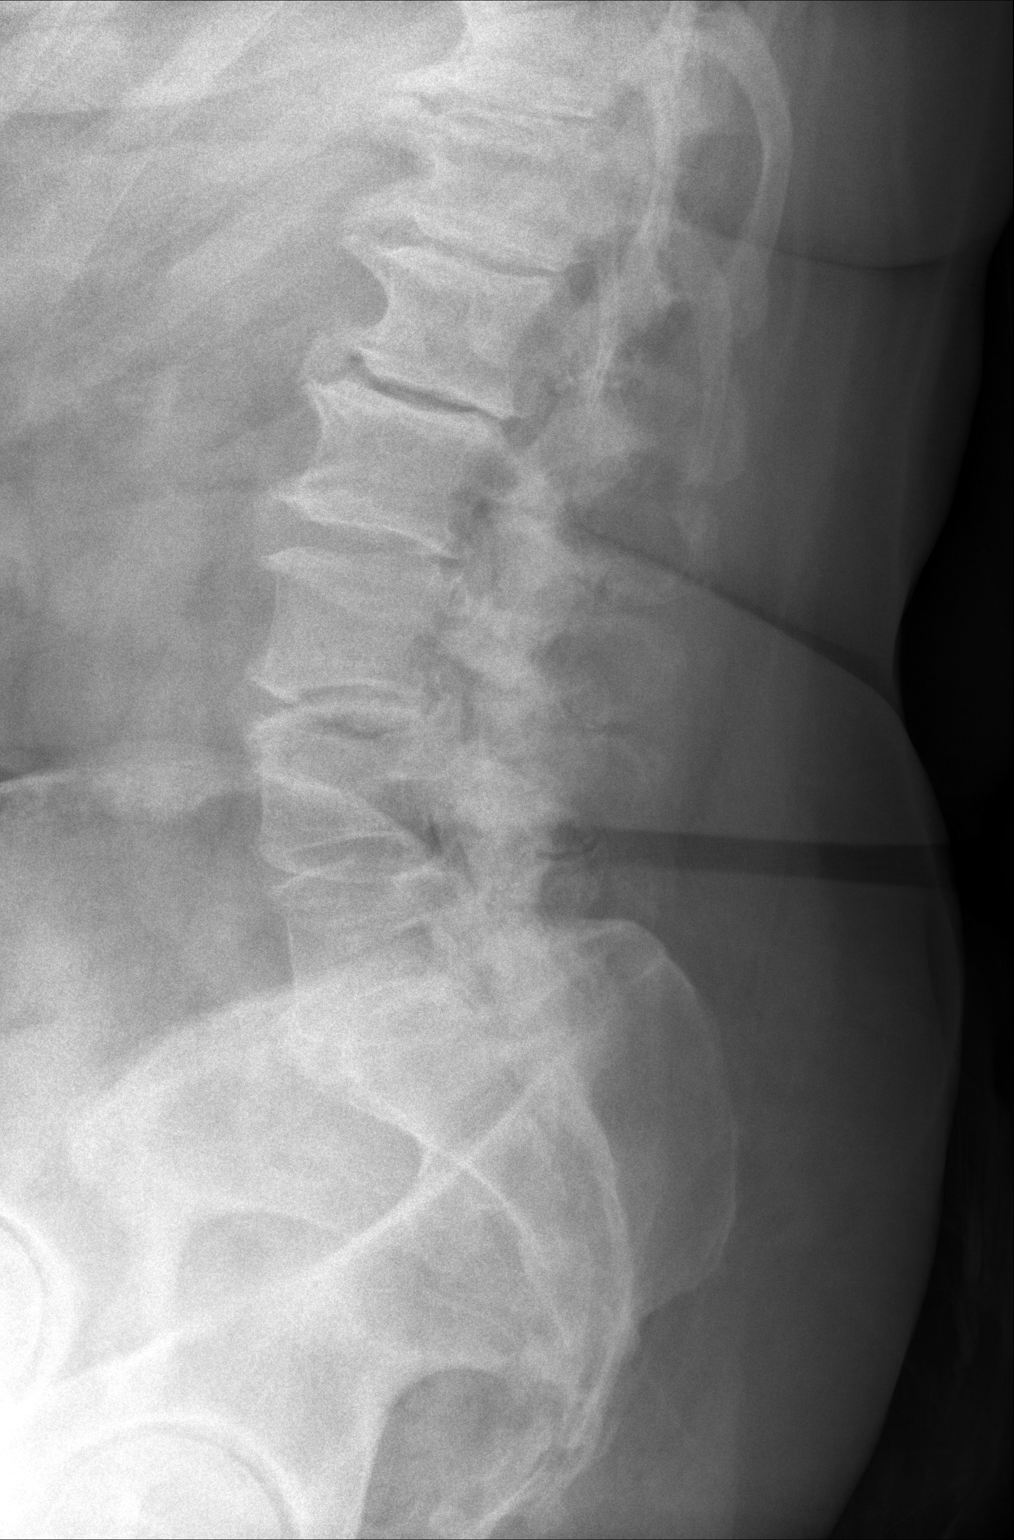

[l5-s1 spot]
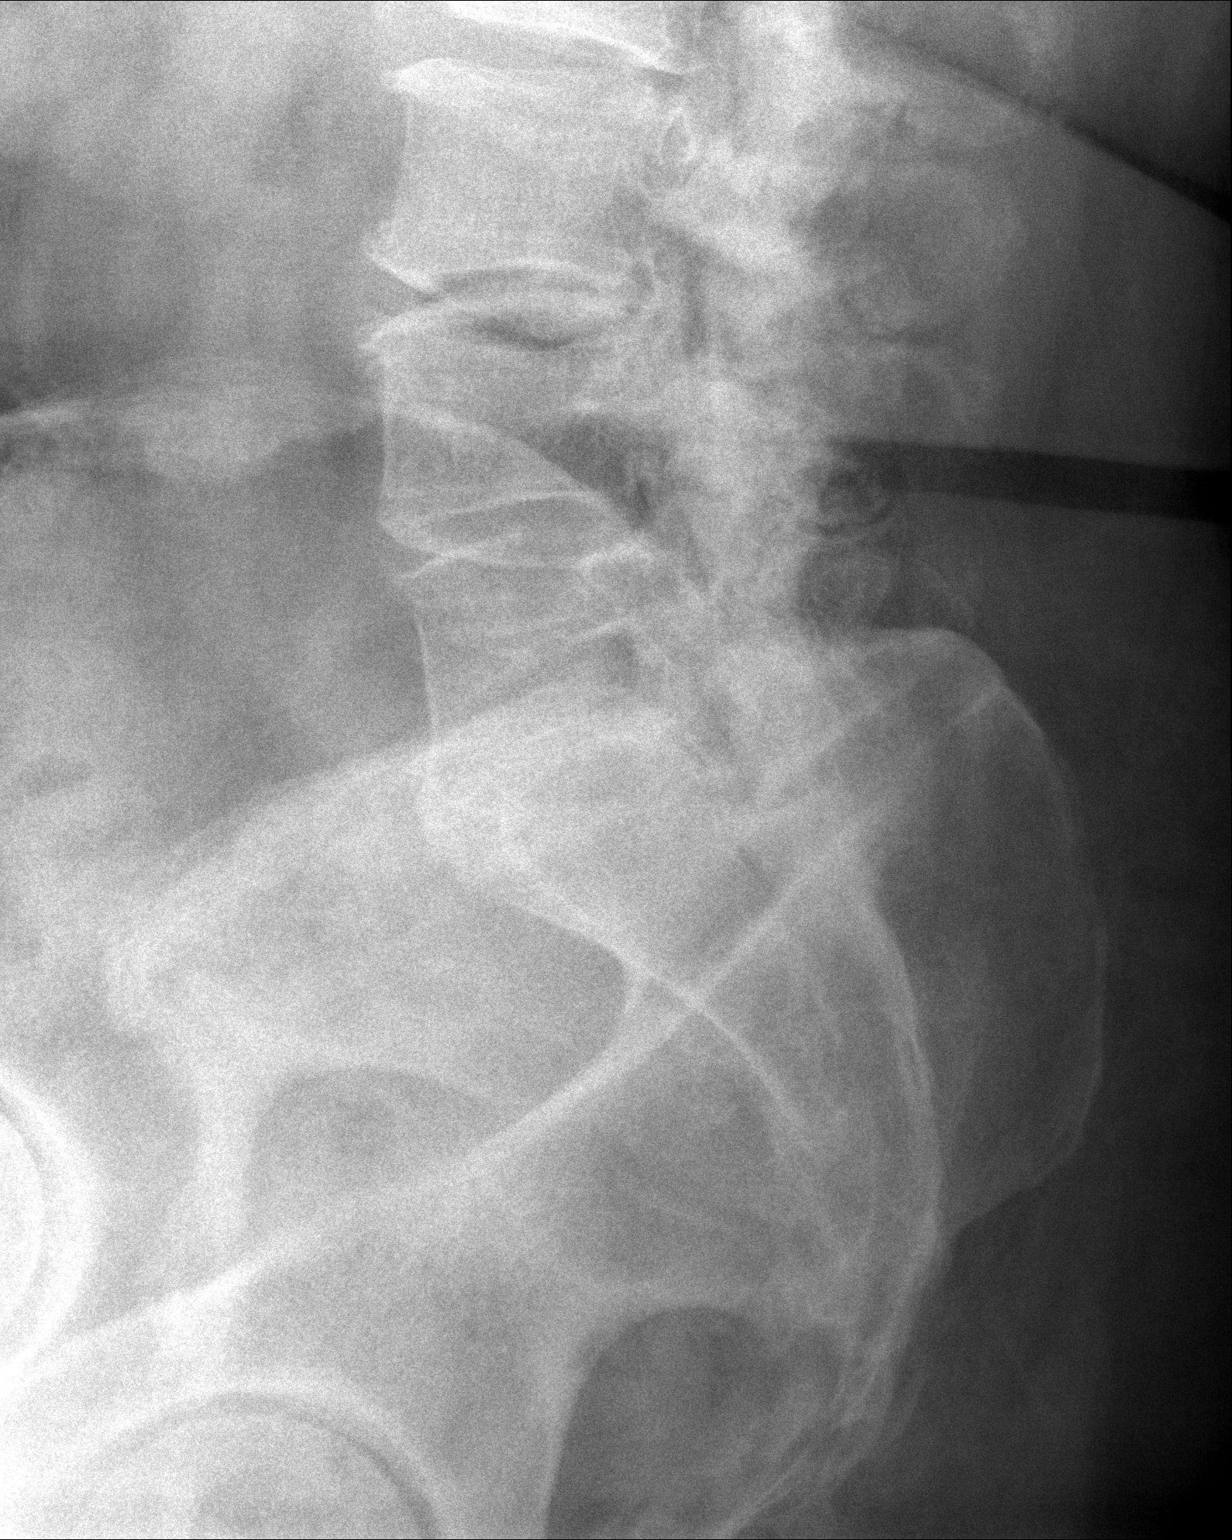

[3 of 3 positions shown; findings below may reference images not displayed]

FINDINGS: The lumbar vertebral body heights are maintained.  Visualized portions of the bony pelvis appear intact. Soft tissues are unremarkable. Facet arthropathy in the lower lumbar spine. Degenerative changes of the sacroiliac joints. Degenerative disc height loss of L3-L4 and L4-L5. No listhesis.
IMPRESSION: 
IMPRESSION: No acute fracture or subluxation of the lumbar spine.  Moderate to severe degenerative change throughout the lumbar spine, slightly worsened since prior exam.

## 2021-10-14 ENCOUNTER — Encounter: Payer: Self-pay | Admitting: Primary Care

## 2021-11-18 NOTE — ED Provider Notes (Signed)
[facility]  History   67 year old female with complaint of productive cough for approximately 2 weeks.  Mild shortness of breath when climbing stairs.  No fever.  No known COVID-19 exposure.  History of positive COVID-19 on 10/12/2021.  She has been taking Mucinex for her cough.  She has received COVID-19 vaccines.      History provided by:  Patient      Patient Active Problem List   Diagnosis   . Hyperlipidemia   . Irritable bowel syndrome   . Leiomyoma of uterus, unspecified   . Prediabetes   . Viral hepatitis B without mention of hepatic coma, chronic, without mention of hepatitis delta       Past Medical History:   Diagnosis Date   . Hepatitis B carrier        Past Surgical History:   Procedure Laterality Date   . CESAREAN SECTION         No family history on file.    Social History     Tobacco Use   . Smoking status: Never   . Smokeless tobacco: Never   Substance Use Topics   . Alcohol use: Yes     Comment: occ   . Drug use: Never     Sexual Activity     Substance and Sexual Activity   Sexual Activity Not Currently   . Partners: Male       Review of Systems   Constitutional: Negative for fever.   HENT: Negative for sore throat.    Respiratory: Positive for cough and shortness of breath.    Gastrointestinal: Negative for abdominal pain, nausea and vomiting.       Physical Exam     Vitals:    11/18/21 1855   BP: 130/87   Pulse: 89   Resp: 16   Temp: 37.5 C (99.5 F)   TempSrc: Temporal   SpO2: 100%       Physical Exam  Vitals and nursing note reviewed.   Constitutional:       General: She is not in acute distress.     Appearance: Normal appearance. She is well-developed. She is not diaphoretic.   HENT:      Right Ear: Tympanic membrane, ear canal and external ear normal.      Left Ear: Tympanic membrane, ear canal and external ear normal.      Nose: Nose normal.      Mouth/Throat:      Mouth: Mucous membranes are moist.      Pharynx: Uvula midline. No posterior oropharyngeal erythema.   Cardiovascular:       Rate and Rhythm: Normal rate and regular rhythm.      Heart sounds: Normal heart sounds.   Pulmonary:      Effort: Pulmonary effort is normal.      Breath sounds: Examination of the left-upper field reveals wheezing. Wheezing present.      Comments: Mild left upper lobe inspiratory wheeze  Musculoskeletal:      Cervical back: Normal range of motion.   Neurological:      Mental Status: She is alert.         ED Procedures   Procedures    ED Course   ED MDM:      Differential Diagnosis:     Differential diagnosis includes:  Acute bronchitis  Pneumonia  RSV  COVID-19  Asthma    ED Course:     ED course details:  66 year old female with complaint  of productive cough and exertional shortness of breath.  Patient was positive for COVID-19 on 10/12/2021.  There were mild inspiratory wheezes on the left upper lobe of lung.  She has been having mild exertional shortness of breath.  I will prescribe an albuterol MDI/chamber.  I will also prescribe Tessalon Perles.  Symptomatic treatment discussed.  ER precautions discussed.  Patient is stable and will be discharged.    Discussion with independent historian(s):     Independent historian(s) utilized:  Patient    Disposition:     Discharge: I had a discussion with the patient and/or guardian regarding discharge diagnosis and plan.  Based on the patient's history, exam and diagnostic evaluation, there is no indication for further emergent intervention or inpatient treatment.  Verbal and written discharge instructions and warnings were provided. Patient was encouraged to return for any worsening symptoms, persisting symptoms, or any other concerns. Patient was provided the opportunity to ask questions.                               ED Attestation   Attestation           Bryson Corona, MD  11/18/21 [id number]

## 2021-11-29 ENCOUNTER — Encounter: Payer: Self-pay | Admitting: Primary Care

## 2021-12-01 ENCOUNTER — Ambulatory Visit
Admission: RE | Admit: 2021-12-01 | Discharge: 2021-12-01 | Disposition: A | Discharge status: Home / Self Care | Payer: PRIVATE HEALTH INSURANCE | Source: Ambulatory Visit

## 2021-12-01 ENCOUNTER — Ambulatory Visit: Payer: PRIVATE HEALTH INSURANCE

## 2021-12-01 ENCOUNTER — Other Ambulatory Visit: Payer: Self-pay

## 2021-12-01 VITALS — BP 120/82 | HR 84 | Temp 97.5°F | Ht 65.0 in | Wt 197.0 lb

## 2021-12-01 DIAGNOSIS — R052 Subacute cough: Secondary | ICD-10-CM | POA: Insufficient documentation

## 2021-12-01 DIAGNOSIS — K449 Diaphragmatic hernia without obstruction or gangrene: Secondary | ICD-10-CM

## 2021-12-01 DIAGNOSIS — R918 Other nonspecific abnormal finding of lung field: Secondary | ICD-10-CM

## 2021-12-01 MED ORDER — AZITHROMYCIN 250 MG PO TABS *I*
ORAL_TABLET | ORAL | 0 refills | Status: AC
Start: 2021-12-01 — End: 2021-12-06

## 2021-12-01 MED ORDER — METHYLPREDNISOLONE 4 MG PO TBPK *A*
ORAL_TABLET | ORAL | 0 refills | Status: DC
Start: 2021-12-01 — End: 2022-09-15

## 2021-12-01 NOTE — Progress Notes (Signed)
Subjective:  Chief Complaint   Patient presents with    Shortness of Breath    Cough        Patient ID: Pamela Mccoy is a 66 y.o. female presents for evaluation of coughing and shortness of breath. She was COVID positive near the end of September. She started coughing following the illness. She went to urgent care and they gave her an inhaler and a cough suppressant. She is using albuterol inhaler about two times a day. The cough is productive, yellow mucus. Cough is worse when she talks. The last couple of days she has felt post nasal drip.     She has not been diagnosed with asthma. She use to have a cat and was mildly allergic. Cough is worse when she sleeps on the couch. Wonders if there is something at work she is allergic too. She works for the [employer] with people with disability.     Feels short of breath when talking and walking up stairs with laundry.   Denies fever and chills.           Past Medical History:   Diagnosis Date    Hepatitis B     Hyperlipidemia      Current Outpatient Medications on File Prior to Visit   Medication Sig Dispense Refill    fluticasone (FLONASE) 50 MCG/ACT nasal spray Spray 1 spray into nostril daily       No current facility-administered medications on file prior to visit.       Review of Systems   All other systems reviewed and are negative.          Objective:  BP 120/82   Pulse 84   Temp 36.4 C (97.5 F)   Ht 1.651 m (5\' 5" )   Wt 89.4 kg (197 lb)   SpO2 96%   BMI 32.78 kg/m   Physical Exam  Constitutional:       General: She is not in acute distress.     Appearance: She is not ill-appearing or diaphoretic.   HENT:      Right Ear: Tympanic membrane, ear canal and external ear normal.      Left Ear: Tympanic membrane, ear canal and external ear normal. There is no impacted cerumen.      Nose: Nose normal.      Mouth/Throat:      Mouth: Mucous membranes are moist.      Pharynx: Oropharynx is clear.   Eyes:      Conjunctiva/sclera: Conjunctivae normal.   Cardiovascular:       Rate and Rhythm: Normal rate and regular rhythm.      Pulses: Normal pulses.      Heart sounds: Normal heart sounds. No murmur heard.     No friction rub. No gallop.   Pulmonary:      Effort: Pulmonary effort is normal. No respiratory distress.      Breath sounds: Rhonchi present. No wheezing or rales.      Comments: Frequent coughing when attempting to deep breath   Neurological:      Mental Status: She is alert.             Assessment/Plan:     ICD-10-CM ICD-9-CM   1. Subacute cough  R05.2 786.2     Productive cough with yellow phlegm for the last 4 weeks.  Lung examination difficult due to frequent coughing.  Recommending treating her cough with a steroid and azithromycin to cover for atypicals.  Recommending chest  x-ray to rule out pneumonia, though this infection seems to be in the bronchioles.  Advised her to contact the office if symptoms worsen or fail to improve.    Eustaquio Maize, NP

## 2021-12-02 ENCOUNTER — Ambulatory Visit: Payer: PRIVATE HEALTH INSURANCE

## 2021-12-02 ENCOUNTER — Telehealth: Payer: Self-pay

## 2021-12-02 MED ORDER — AMOXICILLIN-POT CLAVULANATE 875-125 MG PO TABS *I*
1.0000 | ORAL_TABLET | Freq: Two times a day (BID) | ORAL | 0 refills | Status: AC
Start: 2021-12-02 — End: 2021-12-07

## 2021-12-02 NOTE — Addendum Note (Signed)
Addended by: Morey Hummingbird on: 12/02/2021 02:37 PM     Modules accepted: Orders

## 2021-12-02 NOTE — Telephone Encounter (Signed)
Patient was informed and verbalized understanding.    Please send Rx asap, patient states her pharmacy closes at [facility] and she would like her rx today.

## 2021-12-02 NOTE — Telephone Encounter (Signed)
Please inform the patient that her xray is concerning for pneumonia. I will send in another antibiotic Augmentin. She will take the zpak and the augmentin together. She should call the office if symptoms do not respond or worsen.    Thank you   Eustaquio Maize, NP

## 2021-12-02 NOTE — Telephone Encounter (Signed)
Pt informed

## 2021-12-02 NOTE — Telephone Encounter (Signed)
Script sent.   Thank you   Ayven Glasco M Denim Start, NP

## 2021-12-05 ENCOUNTER — Telehealth: Payer: Self-pay | Admitting: Primary Care

## 2021-12-05 NOTE — Telephone Encounter (Signed)
Letter completed.

## 2021-12-05 NOTE — Telephone Encounter (Signed)
Patient stated that she was written out of work and she is to return tomorrow due to having an severe cough. She says that she took the first round of antibiotics however she says last week after taking xrays pcp prescribed her more biotics for  pneumonia. She says that she would like her note to return back to work to be extended to Friday. She says that she still has an cough and the med is working but she does not feel that she is ready to return.

## 2021-12-05 NOTE — Telephone Encounter (Signed)
Spoke to [employer], told her seh can access the letter thru my chart, she was very appreciative.

## 2022-04-01 IMAGING — CR XR KNEE 2 VIEWS LEFT
4 series · 4 of 4 positions shown · non-contrast
Comparison: none

Left knee pain, no acute trauma
FINAL REPORT:
INDICATION: Pain in left knee pain
Exam: XR KNEE 2 VIEWS LEFT

[AP (1 of 2)]
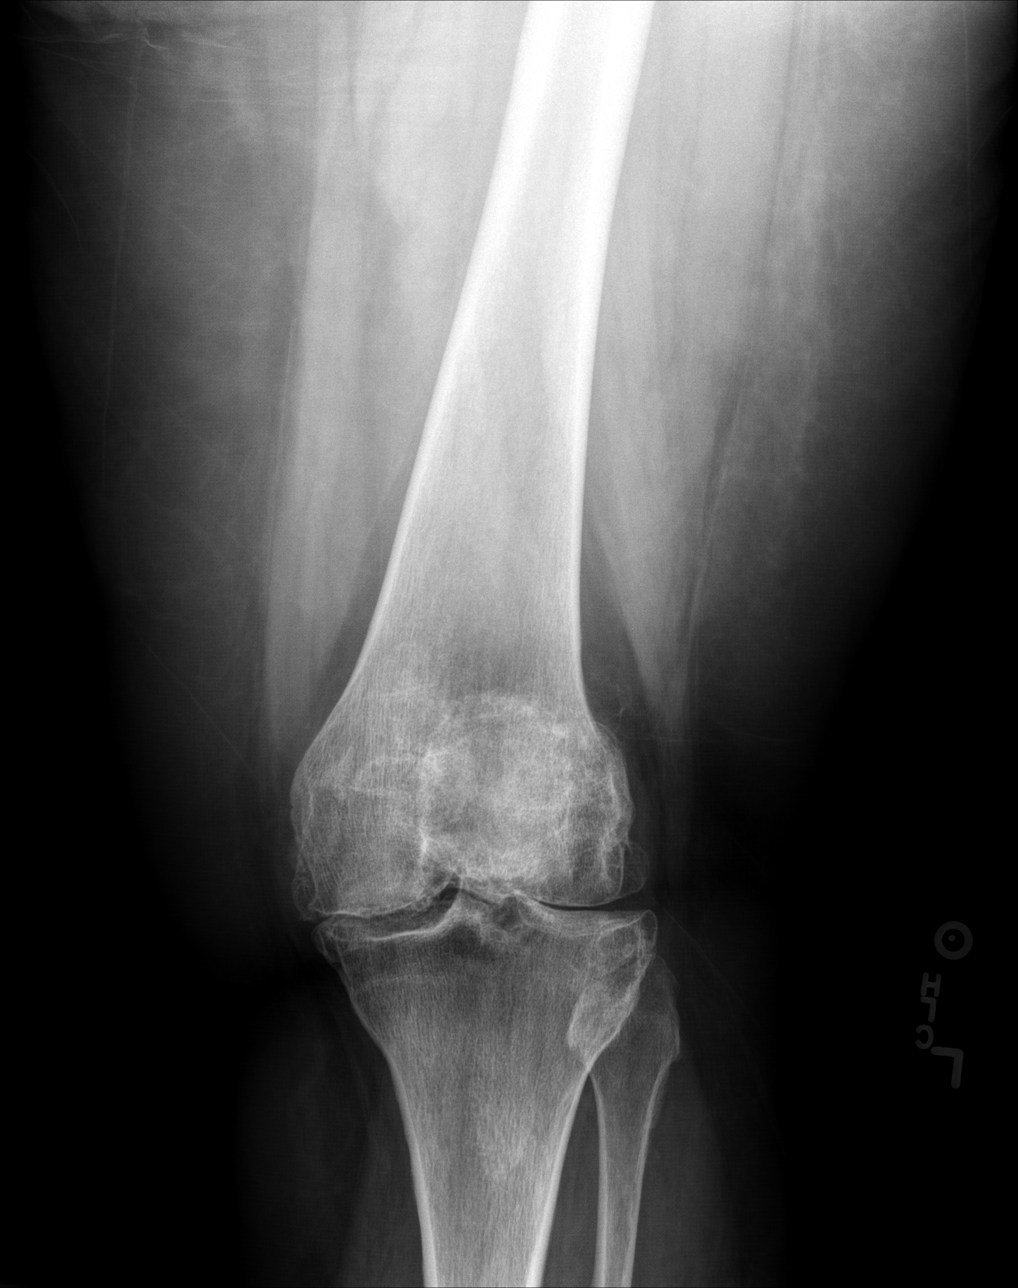

[AP (2 of 2)]
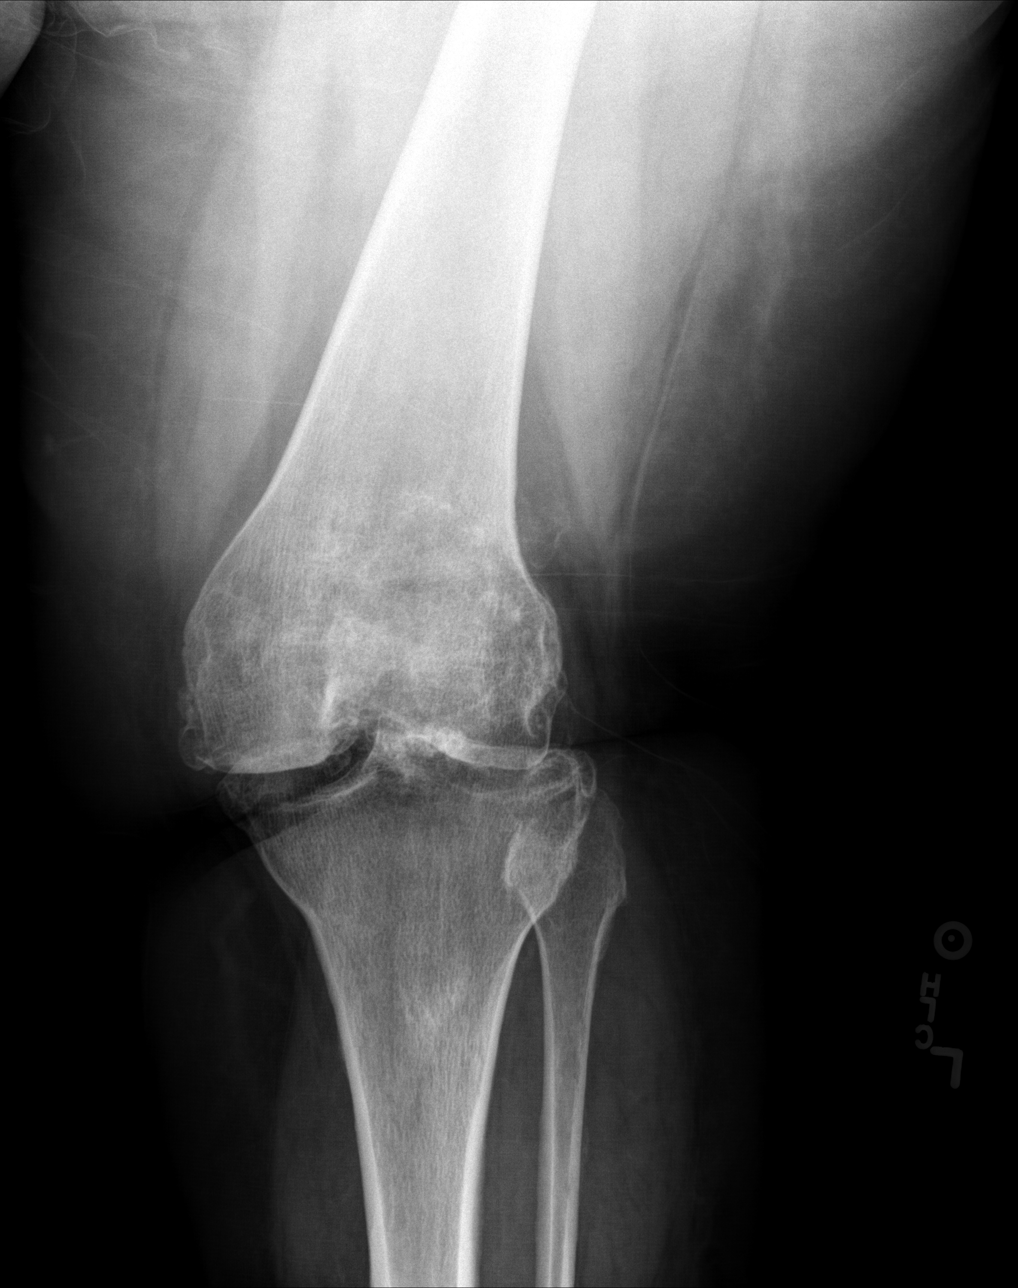

[left lateral (1 of 2)]
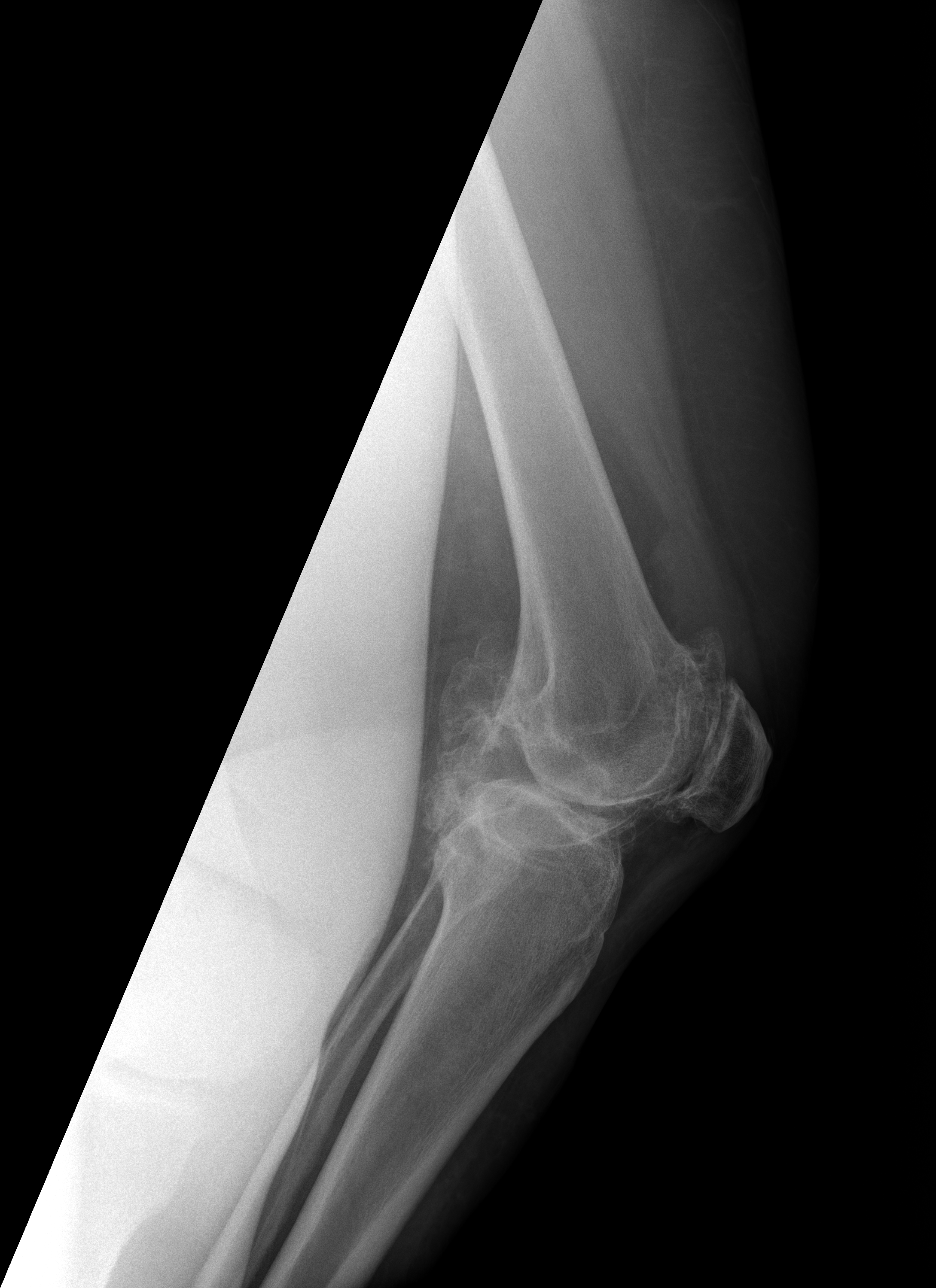

[left lateral (2 of 2)]
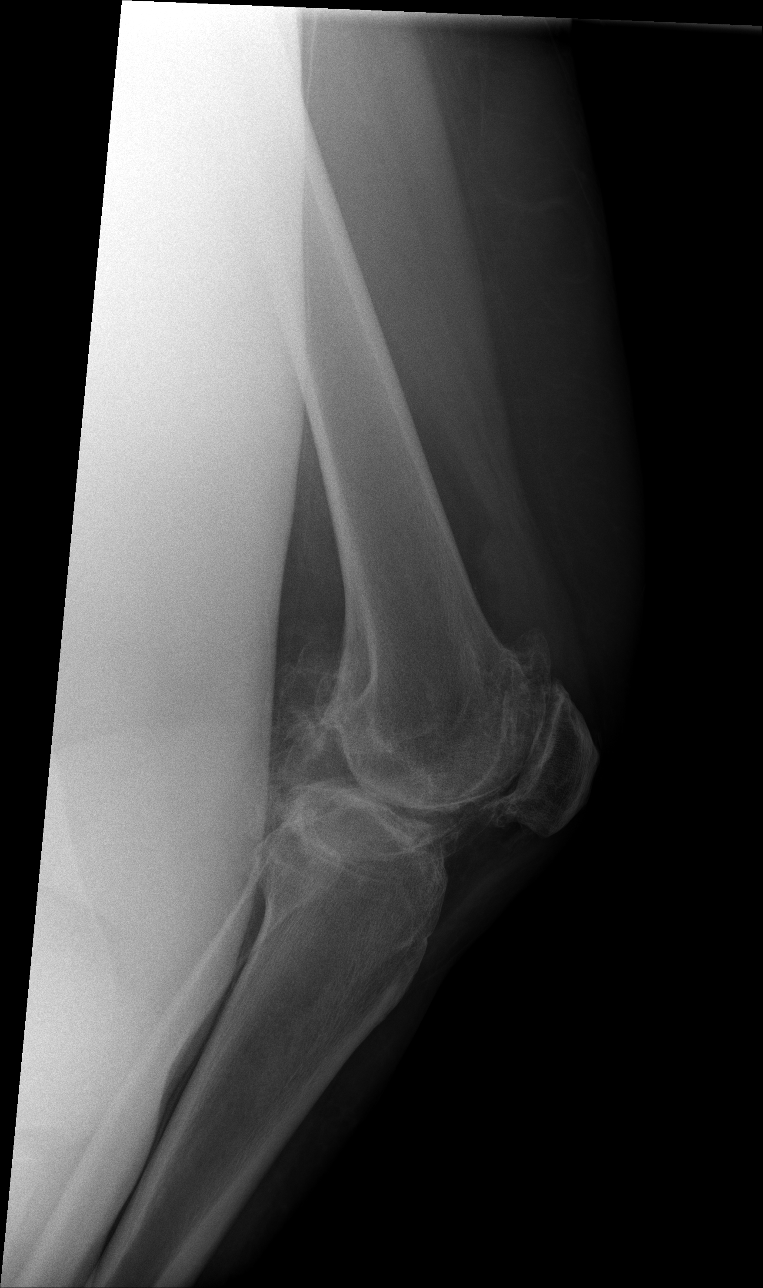

[4 of 4 positions shown; findings below may reference images not displayed]

FINDINGS: 3 views of the left knee show no evidence of acute fracture or subluxation. Severe tricompartmental joint space narrowing with marginal osteophyte formation. Subchondral sclerosis and trace suprapatellar knee joint effusion noted. No suspicious radiopaque foreign body.
IMPRESSION: 
IMPRESSION: 1. Severe osteoarthritic changes of the left knee.

## 2022-06-13 ENCOUNTER — Telehealth: Payer: Self-pay | Admitting: Primary Care

## 2022-06-13 NOTE — Telephone Encounter (Signed)
Reminder letter for mammogram, colon cancer screening mailed to patient

## 2022-08-16 IMAGING — MG MAMMO BREAST SCREENING BILATERAL
8 of 11 series · 8 of 11 positions shown · non-contrast
Comparison: 01/28/2021, 09/30/2016
Indications: 67 years old Female presents for routine screening.

This is a summary report. The complete report is available in the patient's medical record. If you cannot access the medical record, please contact the sending organization for a detailed fax or copy.
Pt seated in w/c for mmg. LD
FINAL REPORT:
MAMMO BREAST SCREENING BILATERAL
TECHNIQUE: Standard digital mammography with CC and MLO views

[R CC (1 of 2)]
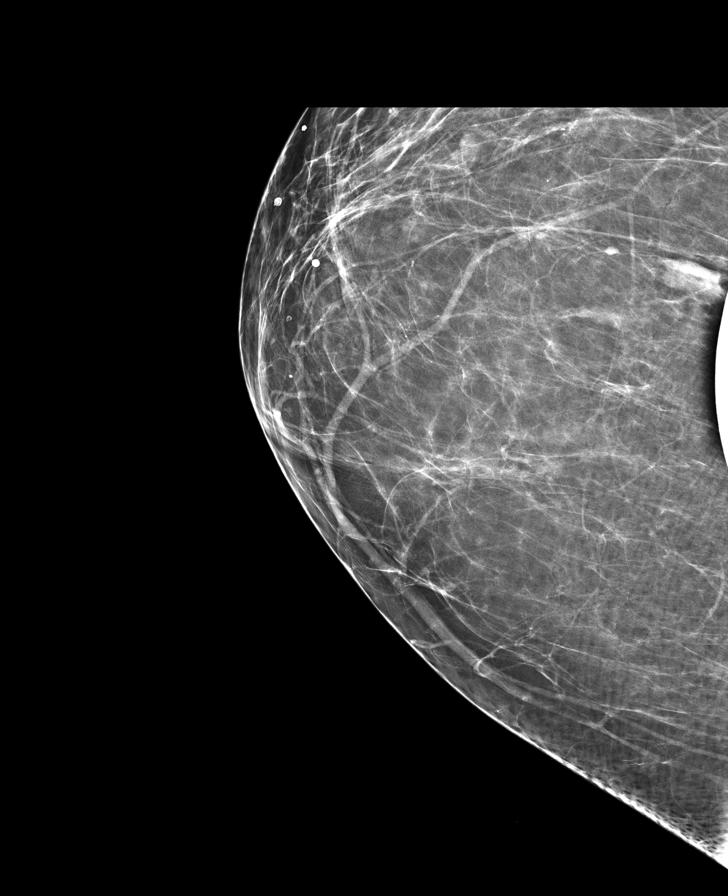

[R XCCL]
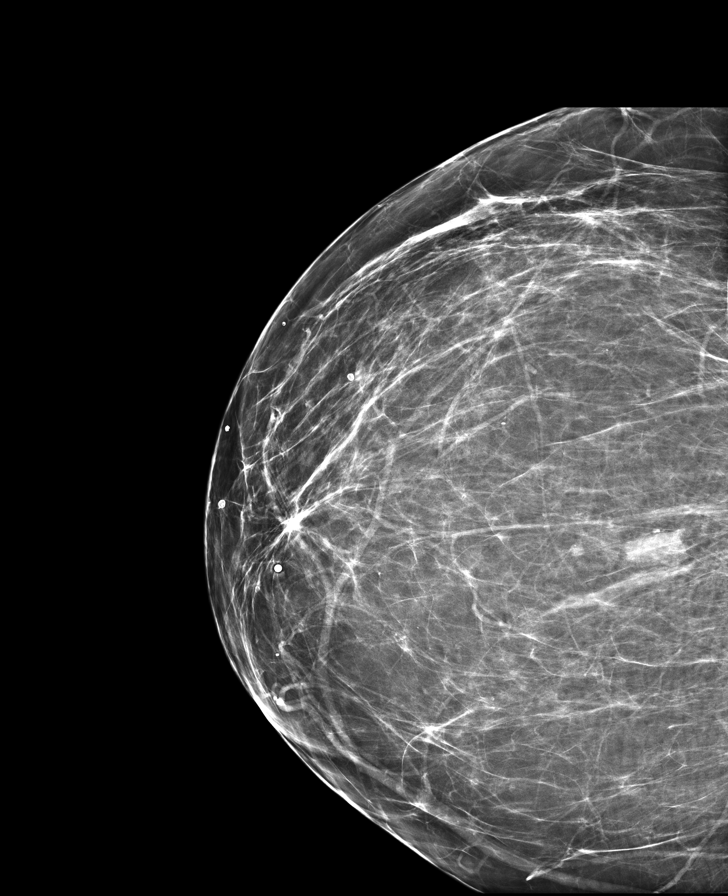

[L XCCL]
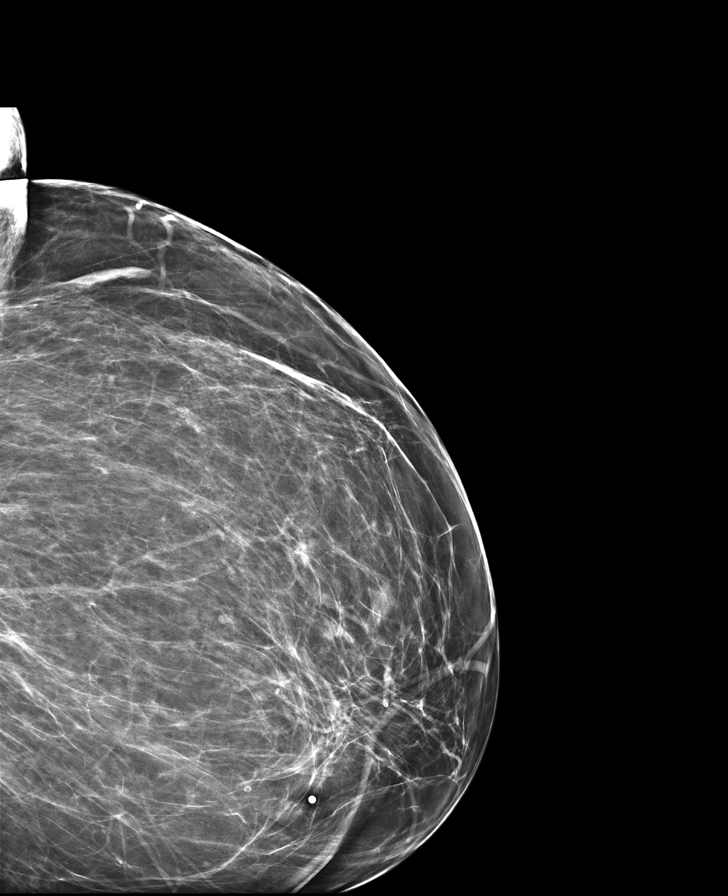

[L CC]
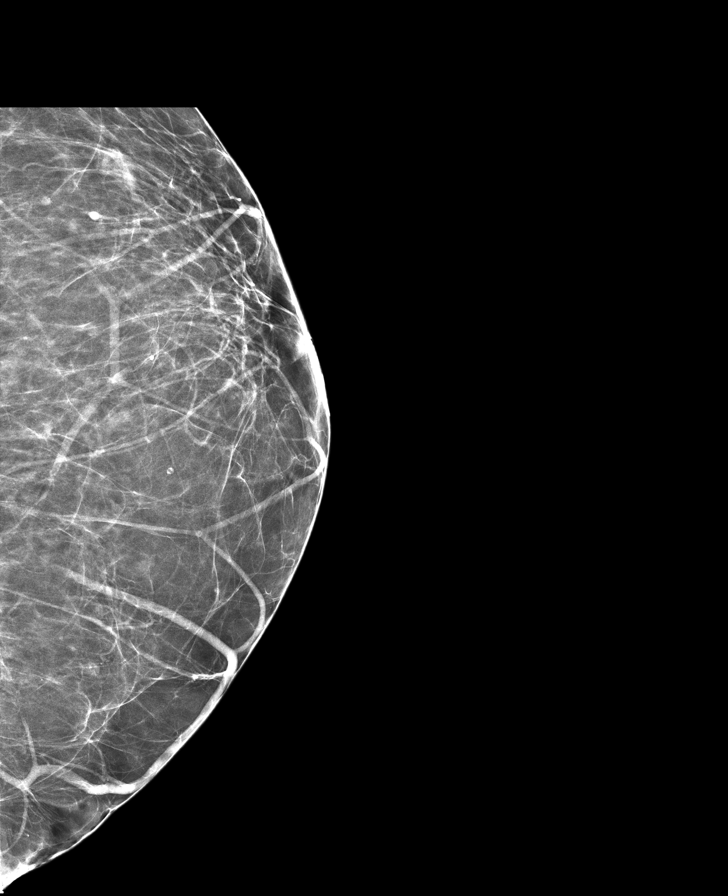

[L MLO]
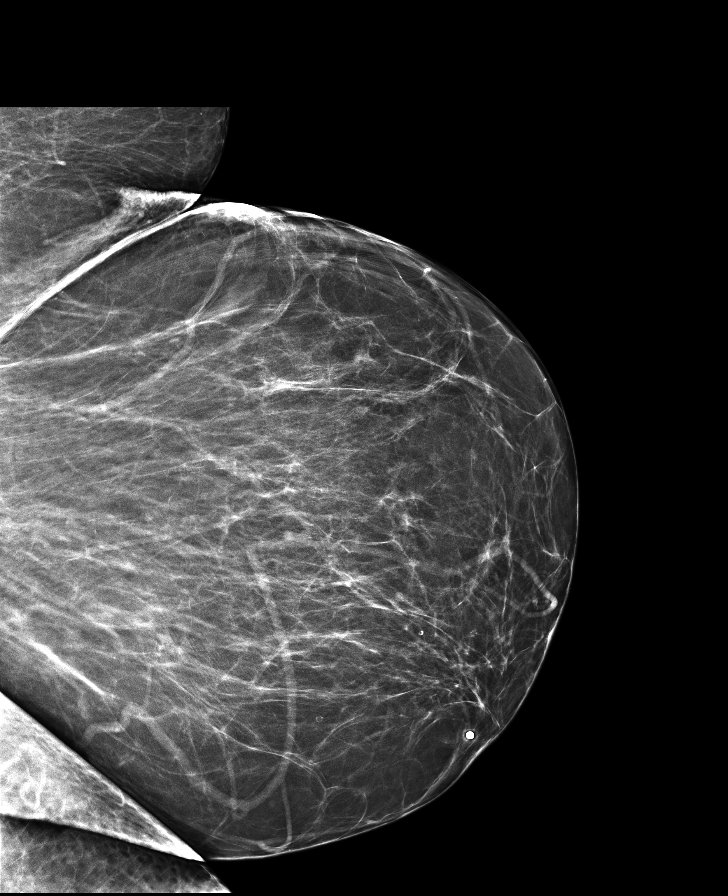

[R MLO (1 of 2)]
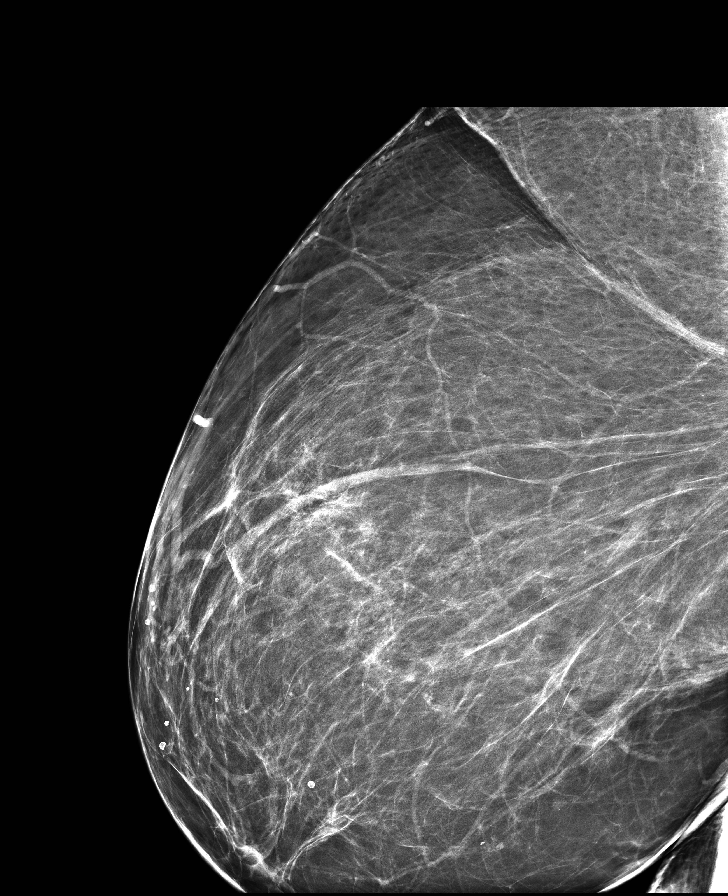

[R MLO (2 of 2)]
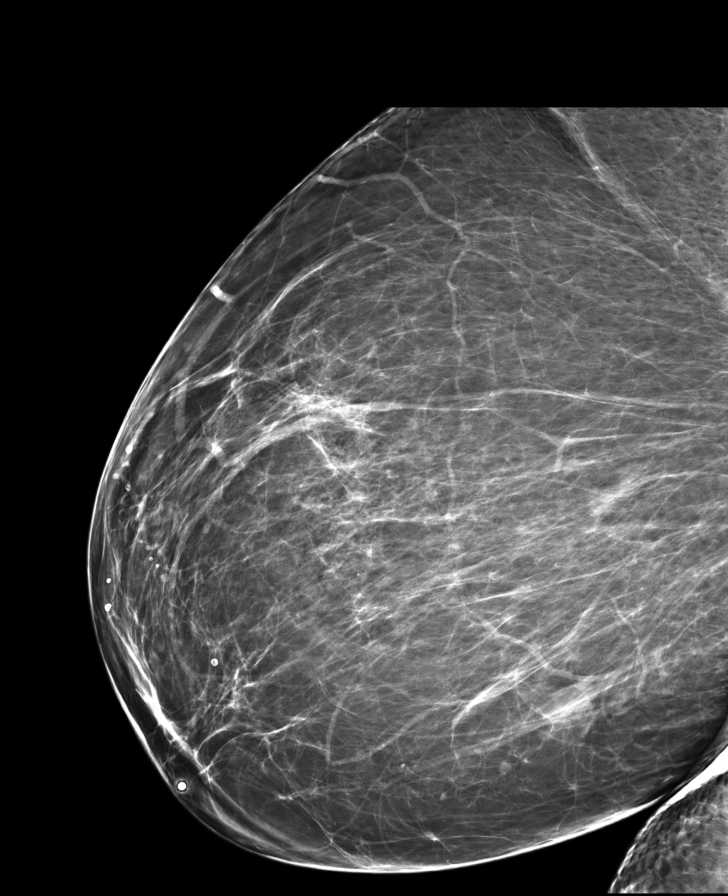

[R CC (2 of 2)]
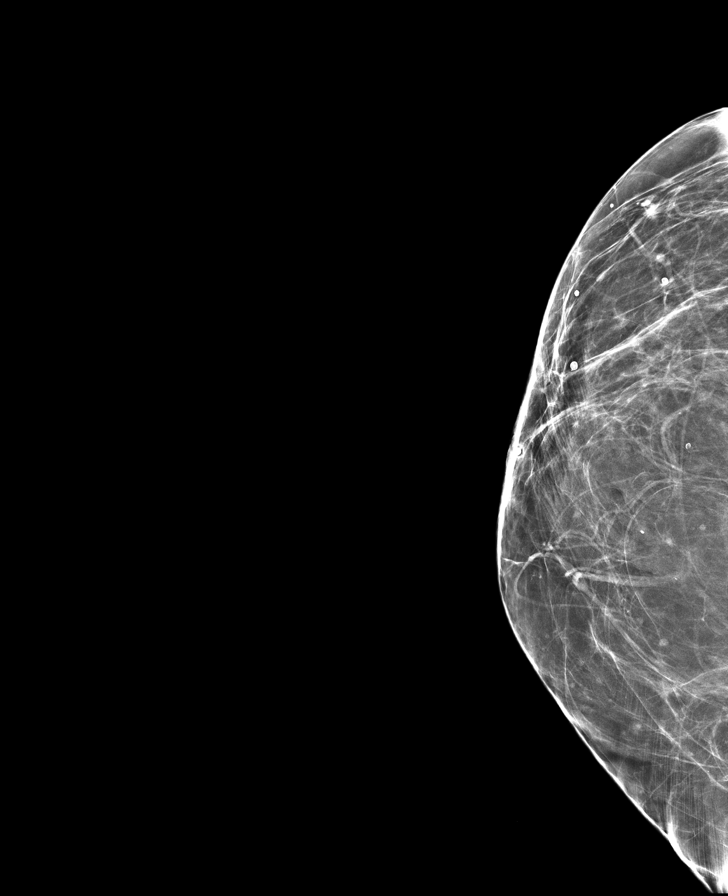

[8 of 11 positions shown; findings below may reference images not displayed]

FINDINGS: Breast density: There are scattered fibroglandular densities (B).
No suspicious mass, calcifications, or other abnormality identified in either breast.
CAD: Utilized
IMPRESSION: No mammographic evidence of malignancy.
BI-RADS Category 1: Negative
Recommendation(s): Routine screening mammogram in one year.
The patient will be notified of these results by mail. The patient information will be entered into a reminder system with target due date for next recommended breast imaging exam.

## 2022-08-30 IMAGING — CR XR LUMBAR SPINE 2-3 VIEWS
1 series · 3 of 3 positions shown · non-contrast
Comparison: None

Low back pain - no known recent trauma
FINAL REPORT:
EXAM: XR LUMBAR SPINE 2-3 VIEWS
CLINICAL INDICATION: pain

[Series 3068: AP · 0.19mm/px · 3 of 3 slices shown]
[im 1/3]
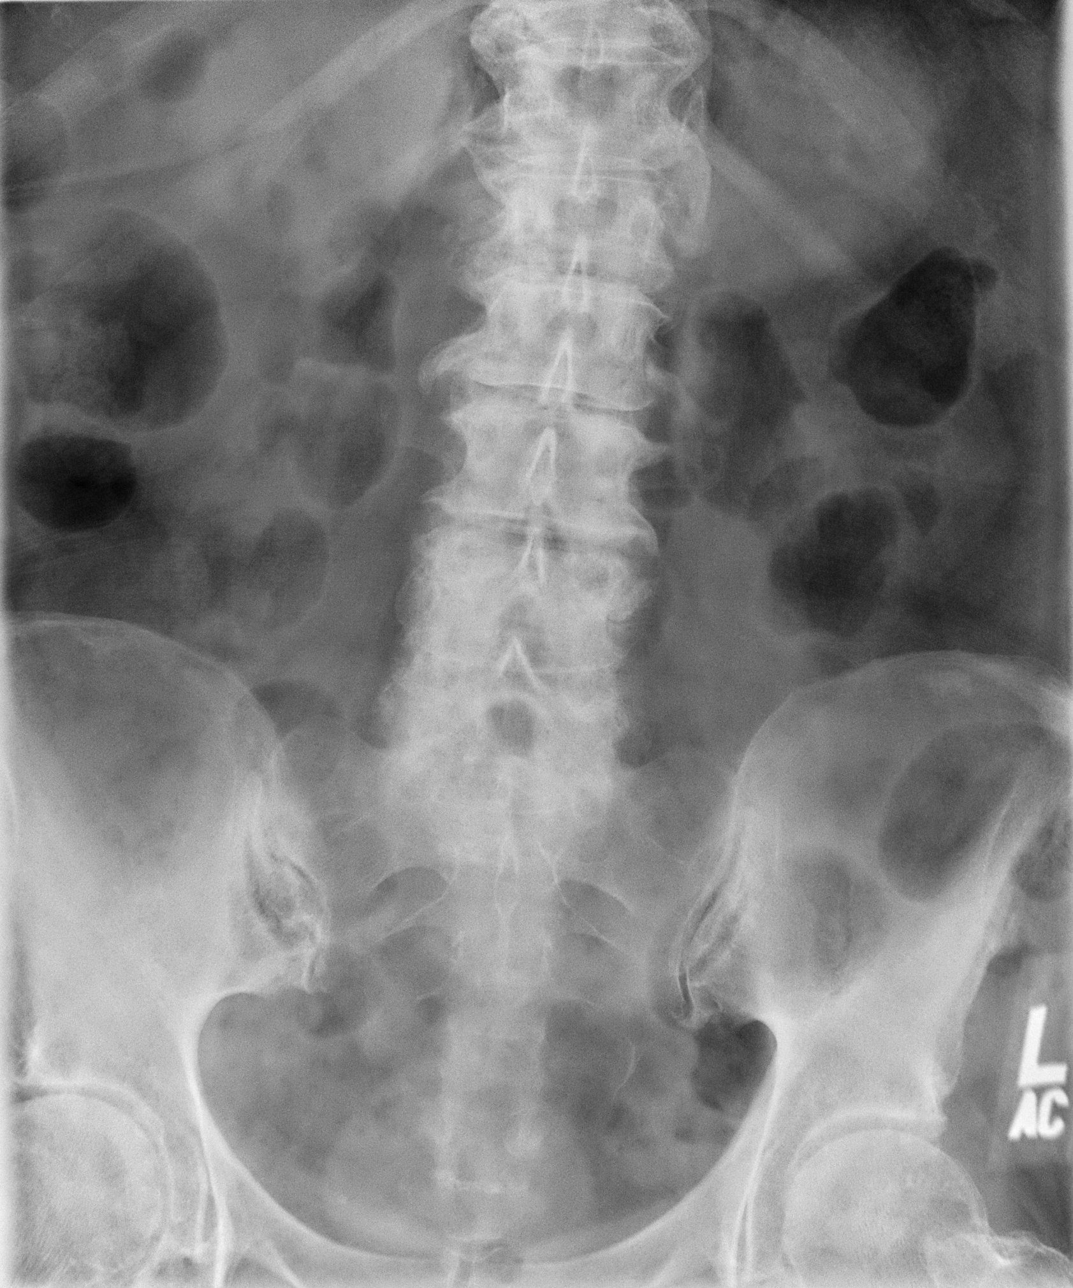
[im 2/3]
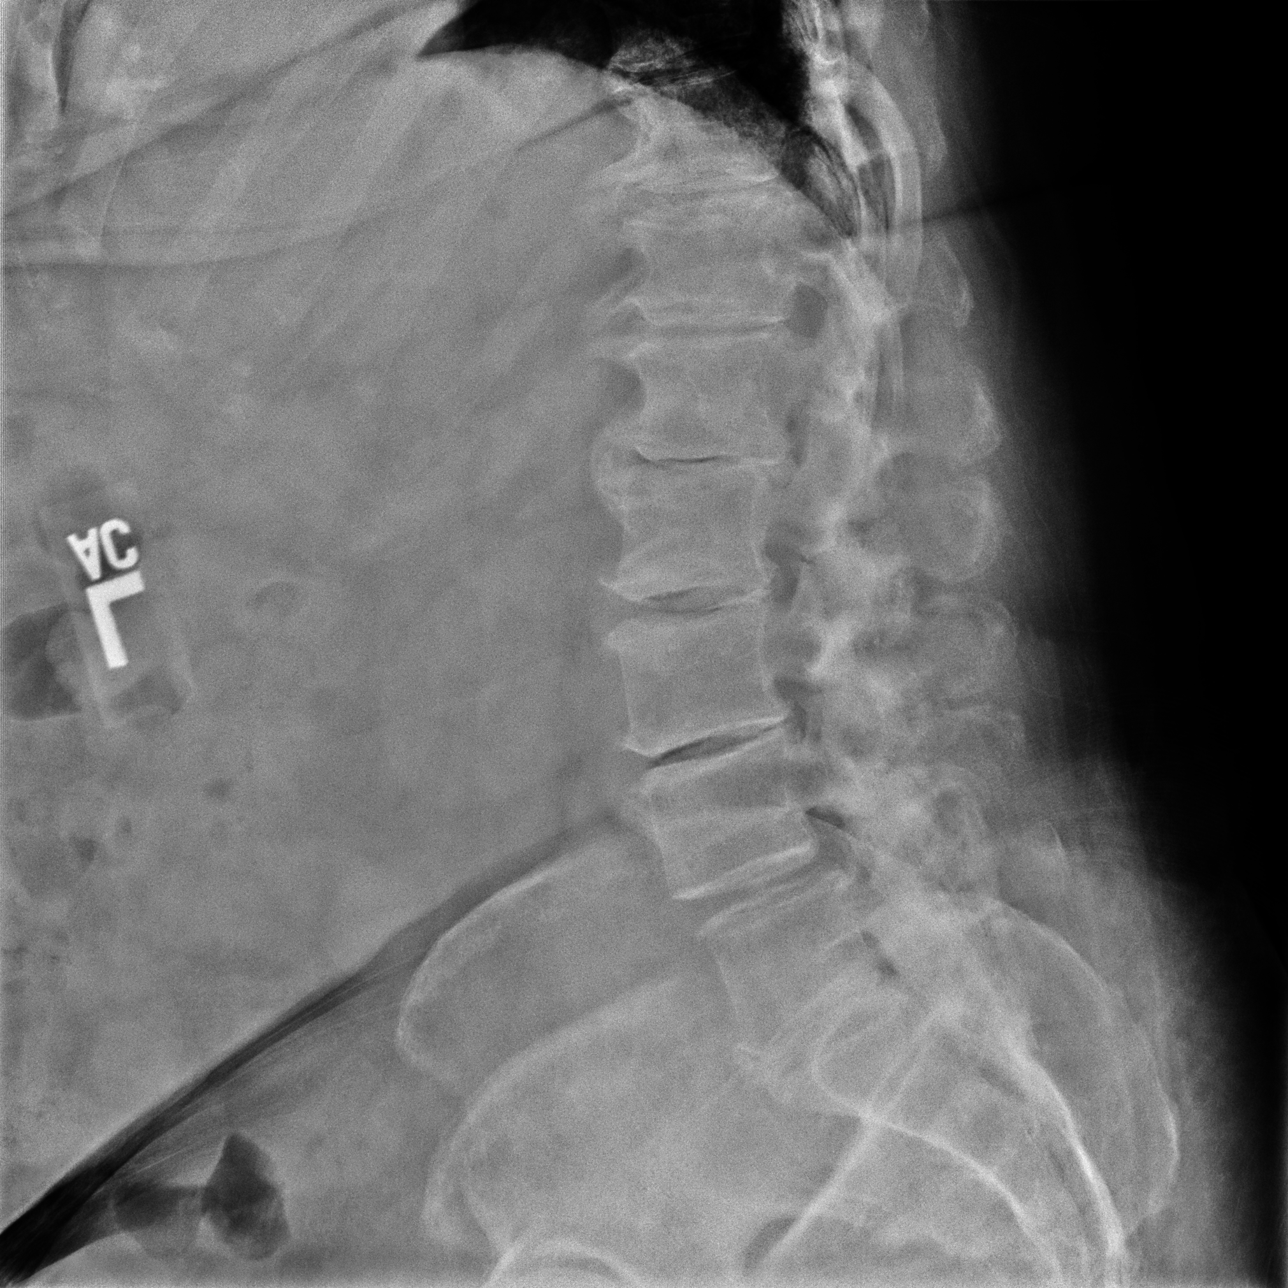
[im 3/3]
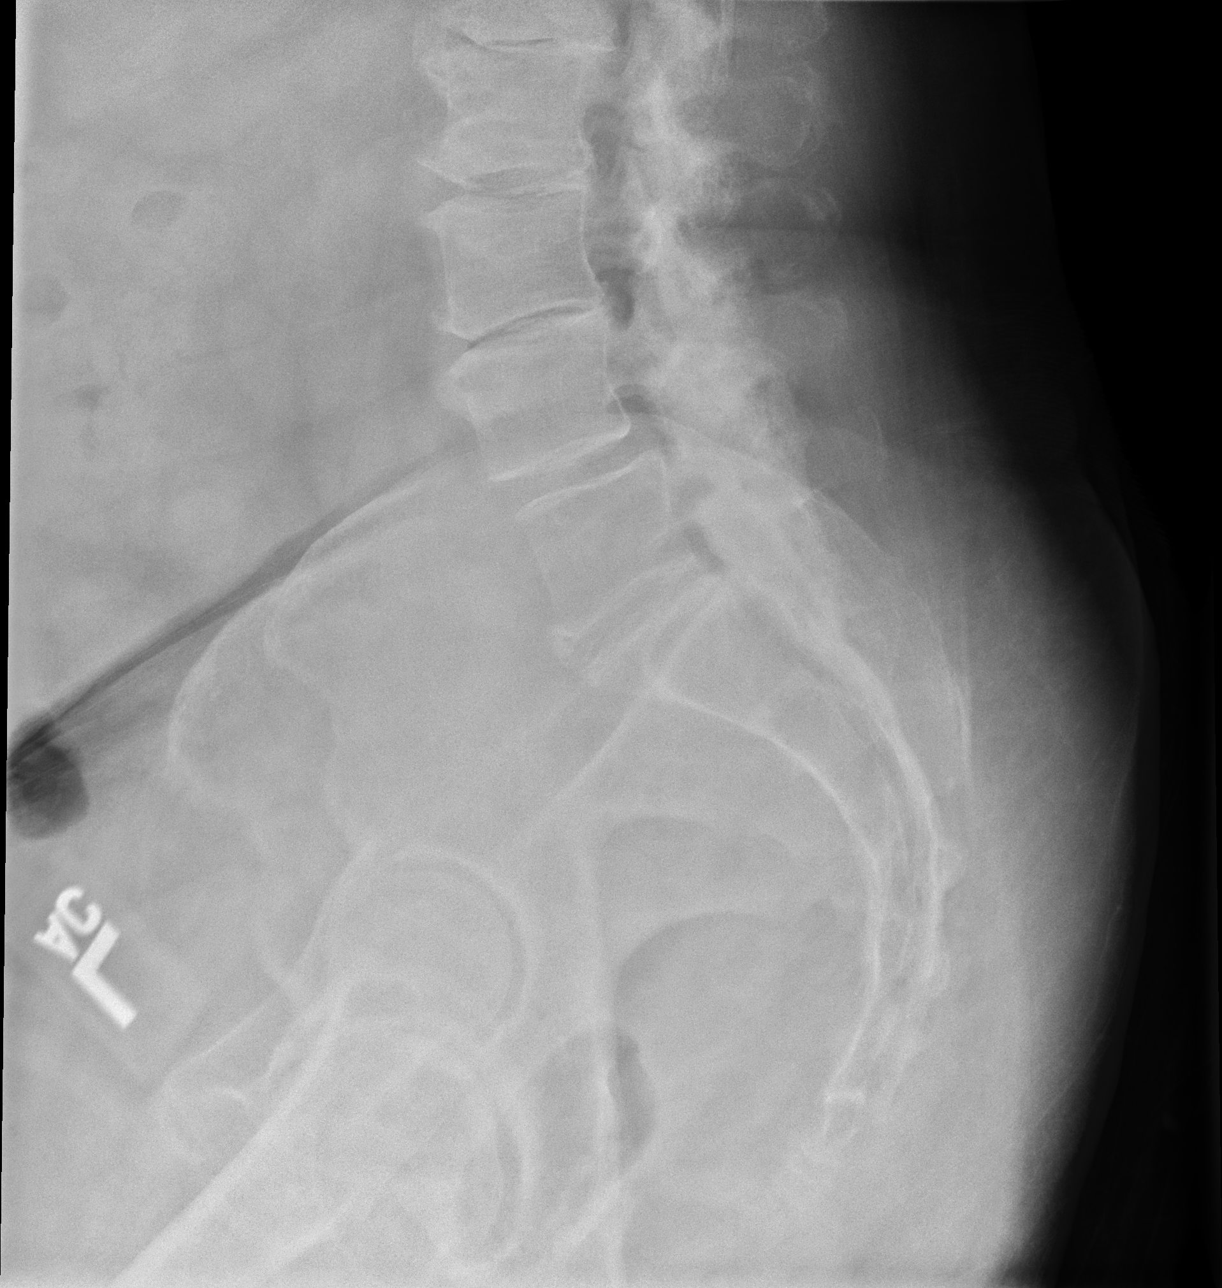

[3 of 3 positions shown; findings below may reference images not displayed]

FINDINGS: Spine: Minimal anterolisthesis of L4 and L5. Minimal retrolisthesis of L1 on L2. Vertebral body heights are maintained. No acute displaced fracture. Multilevel degenerative disc height loss and facet arthrosis.
Soft Tissues: Unremarkable.
IMPRESSION: No acute displaced fracture or traumatic malalignment of the lumbar spine.
Degenerative changes as above.

## 2022-09-12 ENCOUNTER — Telehealth: Payer: Self-pay | Admitting: Primary Care

## 2022-09-12 NOTE — Telephone Encounter (Signed)
Patient is calling -   states that she needs an "out of work note".  She reports that she tested positive for Covid on Friday, 8/23, but did not feel well on 8/22 (did not go to work).  She says that she still does not feel quite well and would like the note to place her out of work til 09/18/2022.  Please advise

## 2022-09-13 NOTE — Telephone Encounter (Signed)
Cannot provide out of work note.   We did not see patient for illness.

## 2022-09-13 NOTE — Telephone Encounter (Signed)
Patient schedule for video appt on 8/30 with Morey Hummingbird to discuss

## 2022-09-15 ENCOUNTER — Ambulatory Visit: Payer: PRIVATE HEALTH INSURANCE

## 2022-09-15 VITALS — BP 113/78 | HR 80 | Ht 65.0 in | Wt 193.0 lb

## 2022-09-15 DIAGNOSIS — U071 COVID-19: Secondary | ICD-10-CM

## 2022-09-15 NOTE — Progress Notes (Signed)
Video Visit    Chief Complaint   Patient presents with    covid positve       HPI  Pamela Mccoy is a 67 y.o. female with a history listed below who presents today for evaluation of COVID 19. She tested positive for COVID on 09/08/22. Symptoms started two days prior and included body aches, sore throat, headache, and fatigue. Symptoms have fully resolved, though she has some residual fatigue.  She needs a return to work note.   Denies fever and chills.  Denies chest pain and shortness of breath.    Medical Problems   Patient Active Problem List   Diagnosis Code    Chronic Hepatitis, B Virus B18.1    Irritable Bowel Syndrome K58.9    Leiomyoma Of The Uterus D25.9    Hyperlipidemia E78.5    Prediabetes R73.03       Current Medications   No current outpatient medications on file.  Medications were reviewed with patient reconciled and no changes made today.     Allergies  Honey, Mango, and Sunflower oil  Allergies were reviewed with patient and confirmed today.     Physical Examination  Video visit, vital signs unavailable with the exception of any home readings as noted in HPI  General: Patient sitting in chair, well appearing, well nourished. No acute distress noted.   Skin: Clean and dry no rashes or lesions noted.   Eyes: Conjunctiva are clear, no discharge or lid edema noted.   ENT: No nasal discharge noted. Voice was not raspy.   Respiratory: Breathing was unlabored, no cough noted. no audible wheezes/rales or rhonchi.     Neuro: Speech was clear, normal mentation, cranial nerves II-XII grossly intact.   Psych: normal affect. Answers questions appropriately.     Assessment/Plan    ICD-10-CM ICD-9-CM   1. COVID-19 virus infection  U07.1 079.89     Symptoms have resolved.  Will send in return to work note for September 19, 2022.  Advised patient to call the office if she has recurring symptoms or persisting fatigue.     Eustaquio Maize, NP     This note was written with the assistance of Dragon dictation software.   There may be inadvertent errors in spelling or word selection despite review of the document.  Apologies are made in advance of such errors.

## 2022-10-05 IMAGING — CR XR HIP 2 OR 3 VW LEFT
3 series · 3 of 3 positions shown · non-contrast
Comparison: Left hip radiograph 08/30/2021

Pain in hip, pain post injury, was unable to do her mri today due to pain
FINAL REPORT:
EXAM: XR HIP 2 OR 3 VW LEFT
CLINICAL INDICATION: Pain in left hip. .

[AP (1 of 2)]
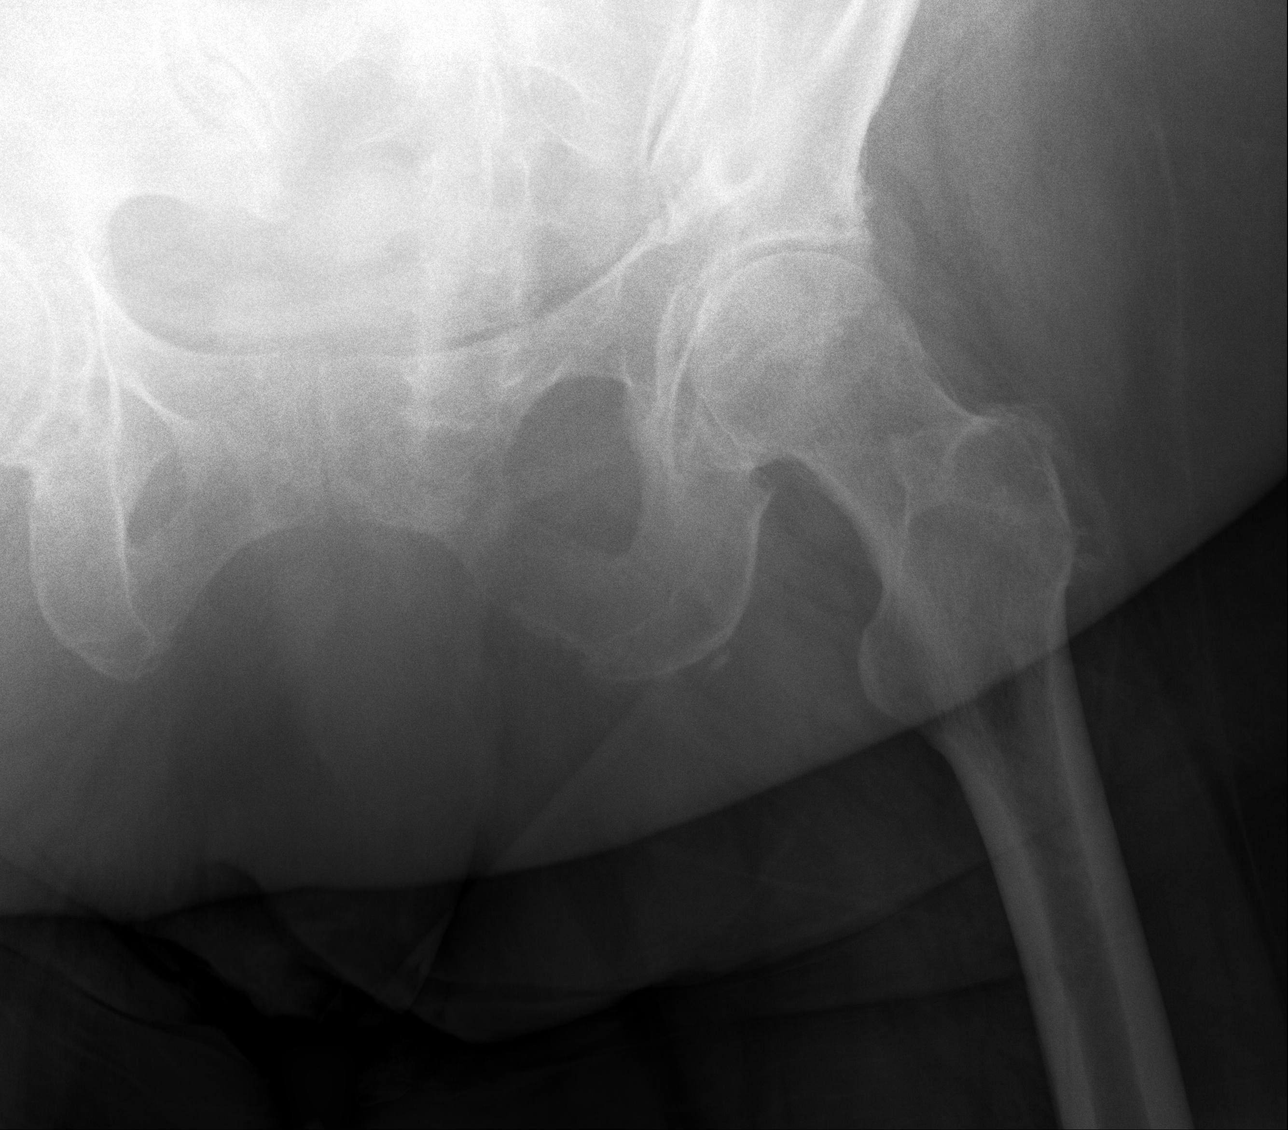

[AP (2 of 2)]
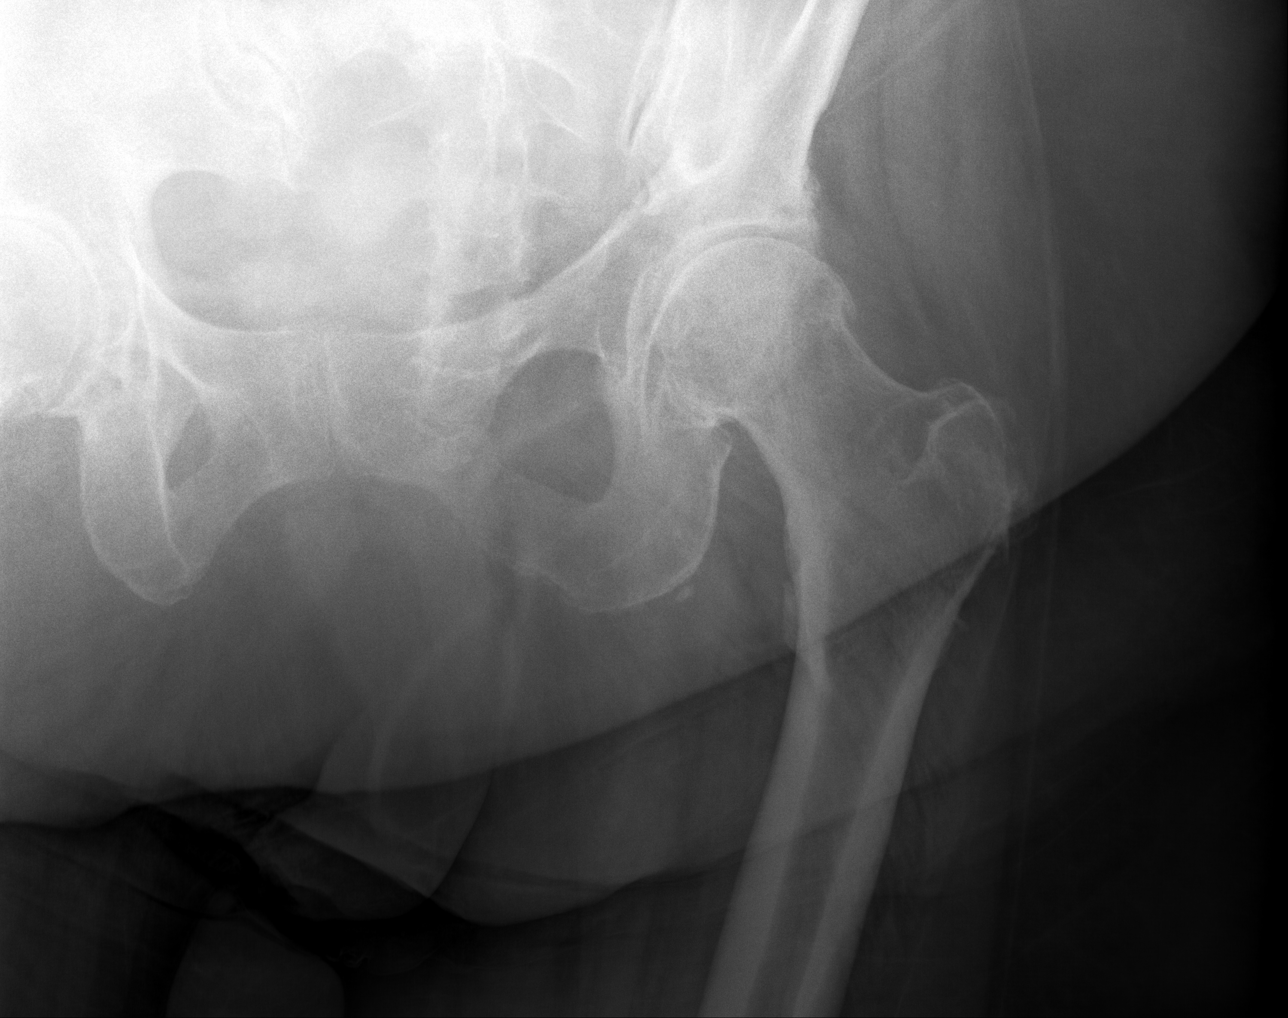

[left lateral]
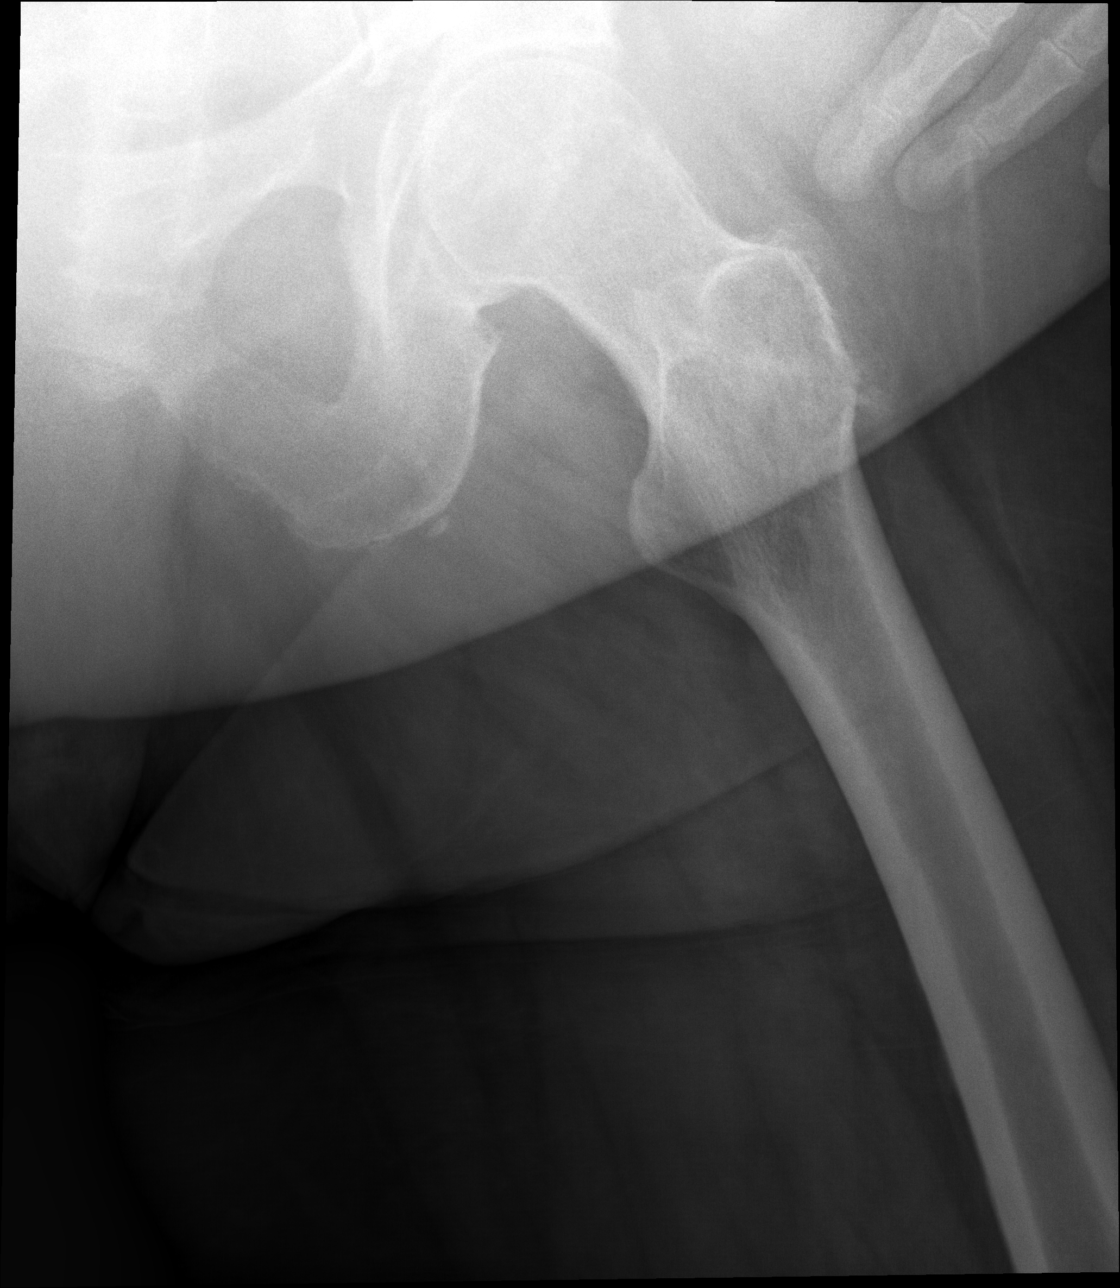

[3 of 3 positions shown; findings below may reference images not displayed]

FINDINGS: Bones: No acute displaced fracture.
Joints: No dislocation. Moderate degenerative changes.
Soft Tissues: Unremarkable.
IMPRESSION: No acute displaced fracture or dislocation.

## 2022-11-23 IMAGING — CT CT ABDOMEN PELVIS WITH CONTRAST
2 of 4 series · 17 of 46 positions shown, 19 images · IV contrast (agent unspecified)
Comparison: 07/06/2017
COMMENT:  Unless the patient's specific circumstances suggest otherwise, any liver lesion 0.5 cm or less, any cystic kidney lesion less than 1.0 cm, and/or any adrenal lesion 1.0 cm or less not otherwise characterized in this report as possessing suspicious or indeterminate imaging features is/are most likely to be benign and do not require follow-up imaging or biopsy.

LLQ abdominal pain
FINAL REPORT:
CT ABDOMEN PELVIS WITH CONTRAST
TECHNIQUE: CT images through the abdomen and pelvis were obtained with intravenous and oral contrast. Dose reduction techniques were utilized for this examination.
CLINICAL INDICATION:  LLQ abdominal pain.

[Series 3: thins · axial · 0.98mm/px · z∈[-350,+77]mm · 14 of 382 slices shown, 16 images]
[im 20/382  soft-tissue]
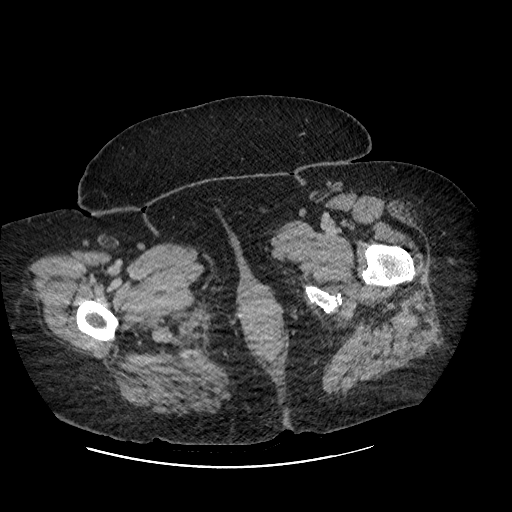
[im 20/382  bone]
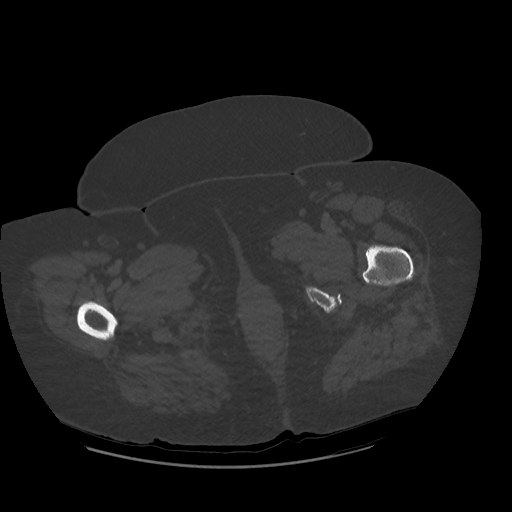
[im 58/382  soft-tissue]
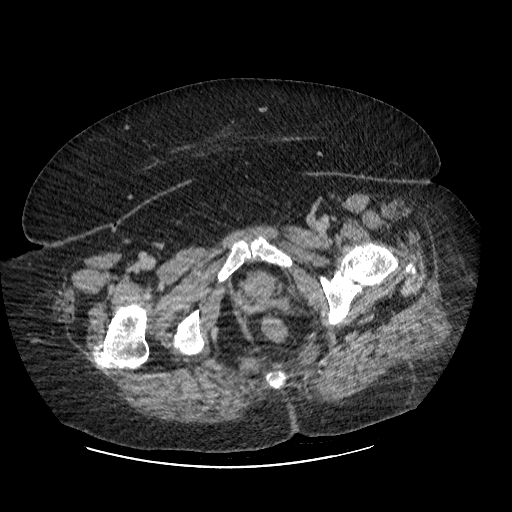
[im 77/382  soft-tissue]
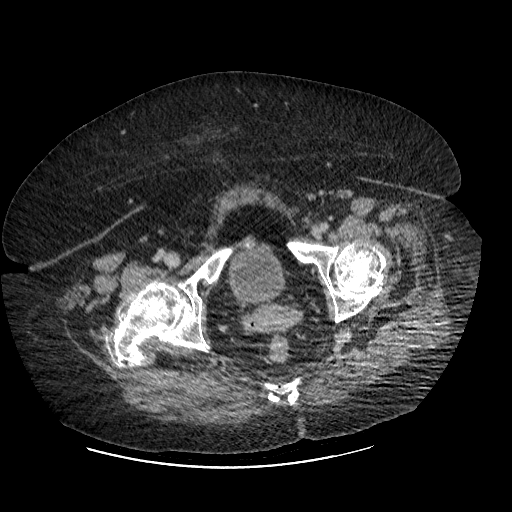
[im 96/382  soft-tissue]
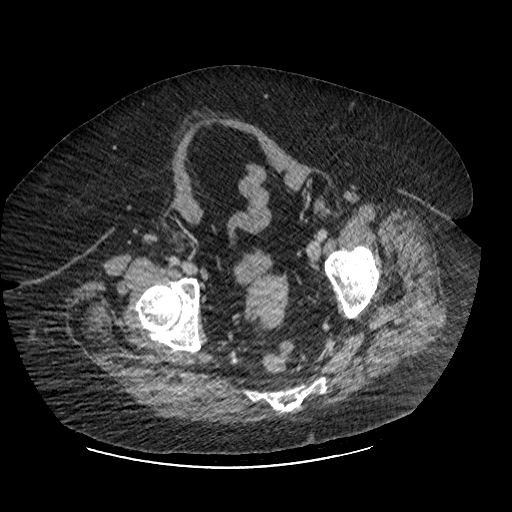
[im 134/382  soft-tissue]
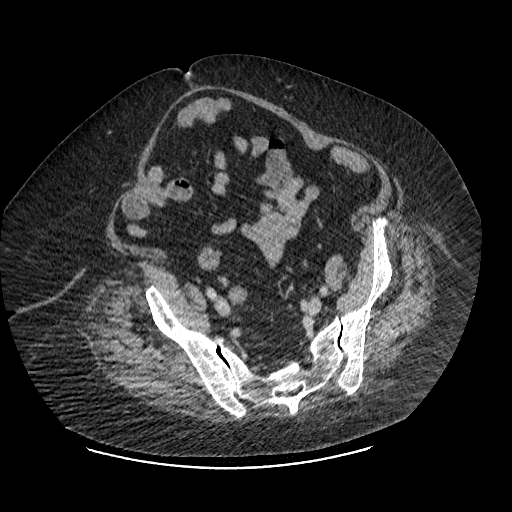
[im 153/382  soft-tissue]
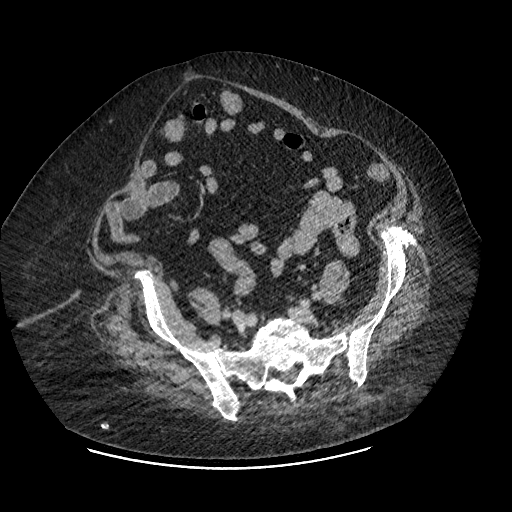
[im 172/382  soft-tissue]
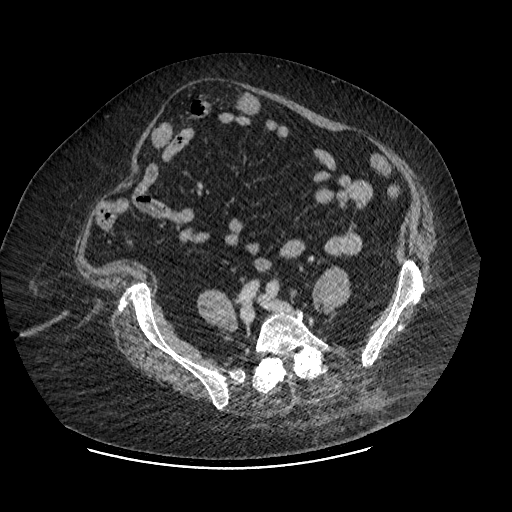
[im 210/382  soft-tissue]
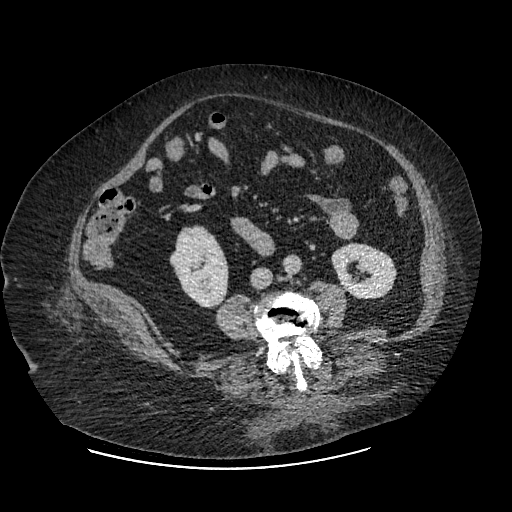
[im 229/382  soft-tissue]
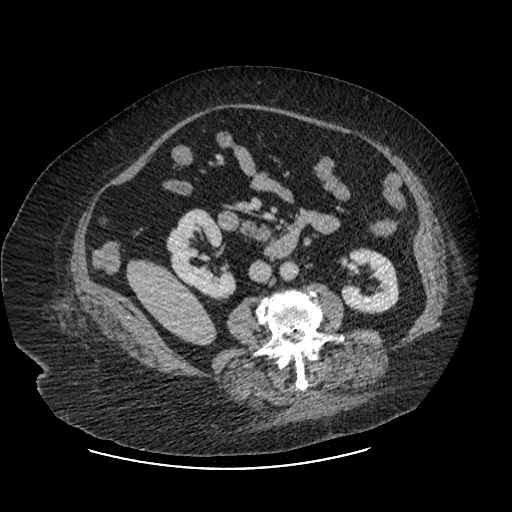
[im 229/382  bone]
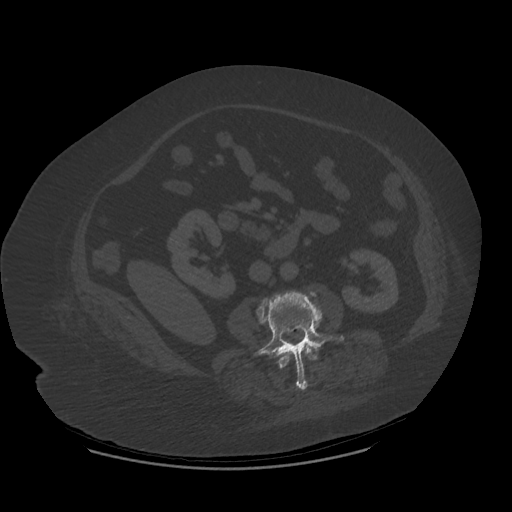
[im 248/382  soft-tissue]
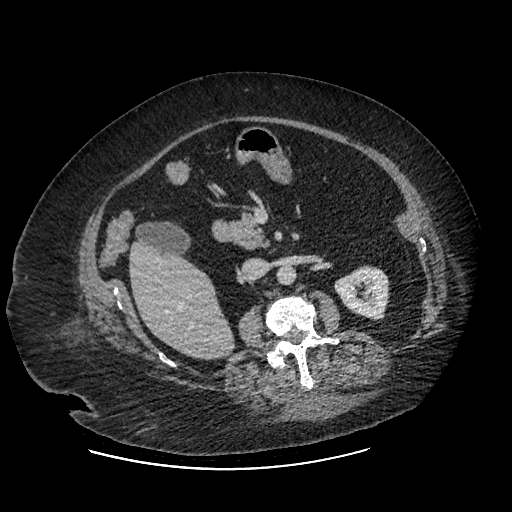
[im 286/382  soft-tissue]
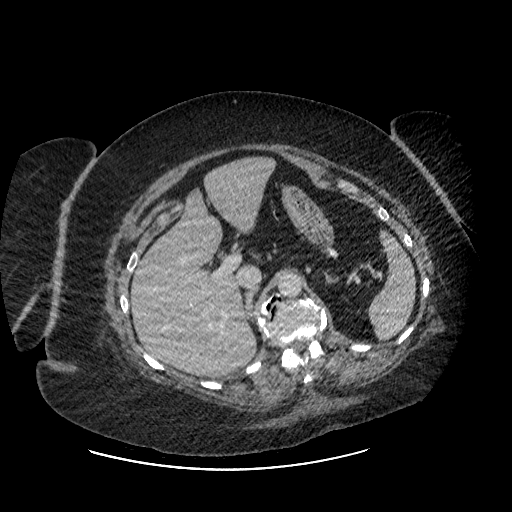
[im 305/382  soft-tissue]
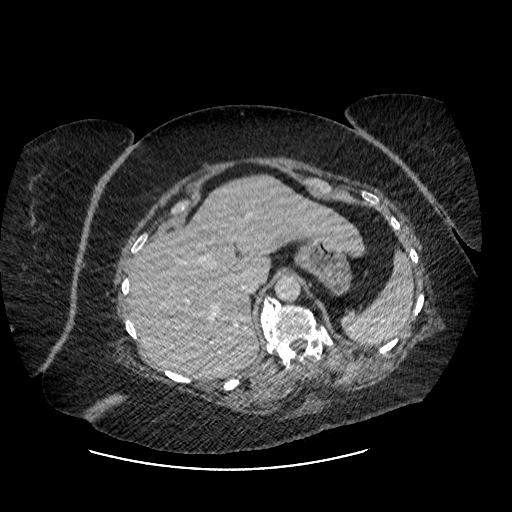
[im 324/382  soft-tissue]
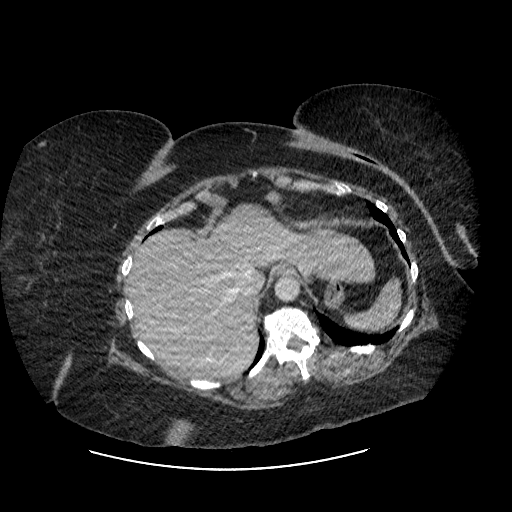
[im 362/382  soft-tissue]
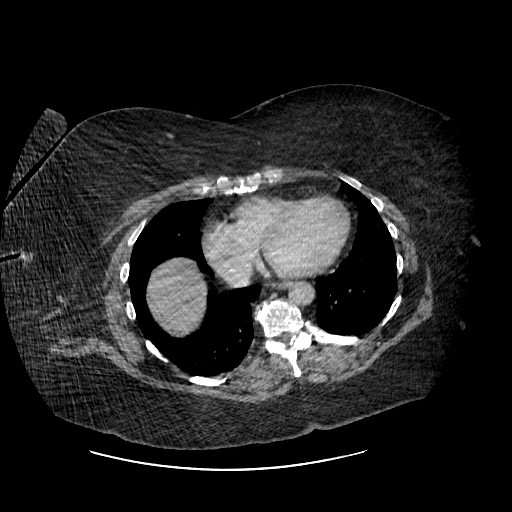

[Series 602: sag standard 2x2 · sagittal · 0.98mm/px · 3 of 236 slices shown]
[im 79/236  soft-tissue]
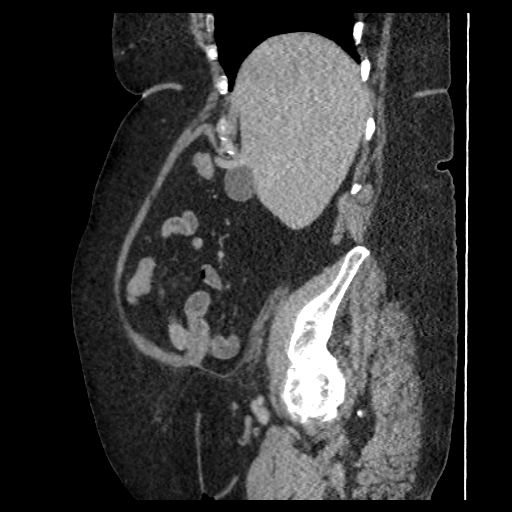
[im 105/236  soft-tissue]
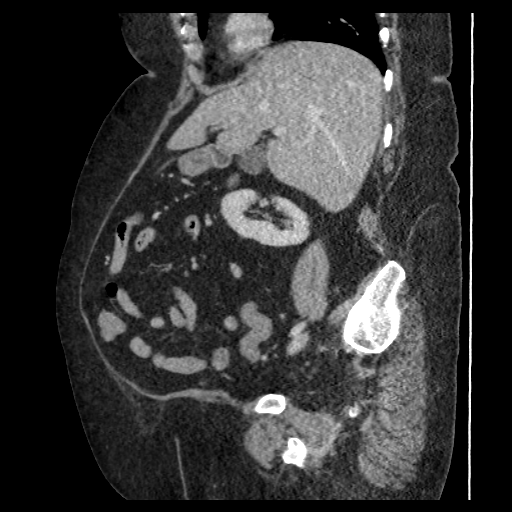
[im 131/236  soft-tissue]
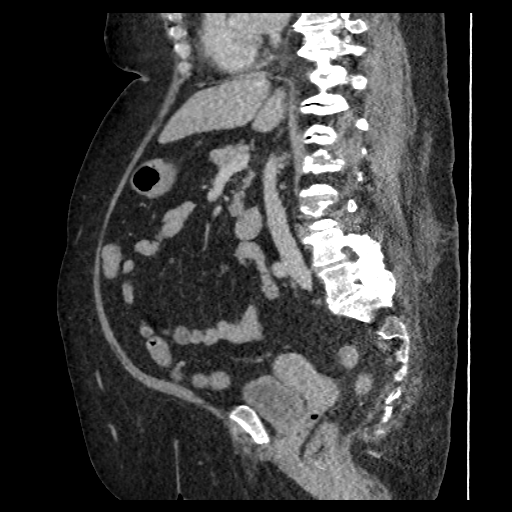

[17 of 46 positions shown; findings below may reference images not displayed]

FINDINGS: Lower Thorax: Unremarkable.
Liver: Unremarkable.
Gallbladder/Biliary Tree: No calcified gallstones or bili ductal dilation.
Spleen: Unremarkable.
Pancreas: Unremarkable.
Adrenal Glands: Unremarkable.
Kidneys/Ureters: Kidneys enhance symmetrically. No hydronephrosis.
Gastrointestinal: No bowel obstruction or acute bowel inflammatory changes.
Bladder: Unremarkable.
Pelvic Organs: Uterus and adnexa are unremarkable.
Lymph Nodes: Unremarkable.
Vessels: Unremarkable.
Peritoneum/Retroperitoneum: Unremarkable.
Bones: Degenerative changes of the spine.
Soft tissues: Diastases recti of the ventral abdominal wall.
IMPRESSION: No acute abnormality in the abdomen or pelvis.

## 2023-07-13 IMAGING — CR PortableChest
1 series · 1 of 1 positions shown · non-contrast
Comparison: None available

FINAL REPORT:
HISTORY: Chest pain
Number of views:1

[AP]
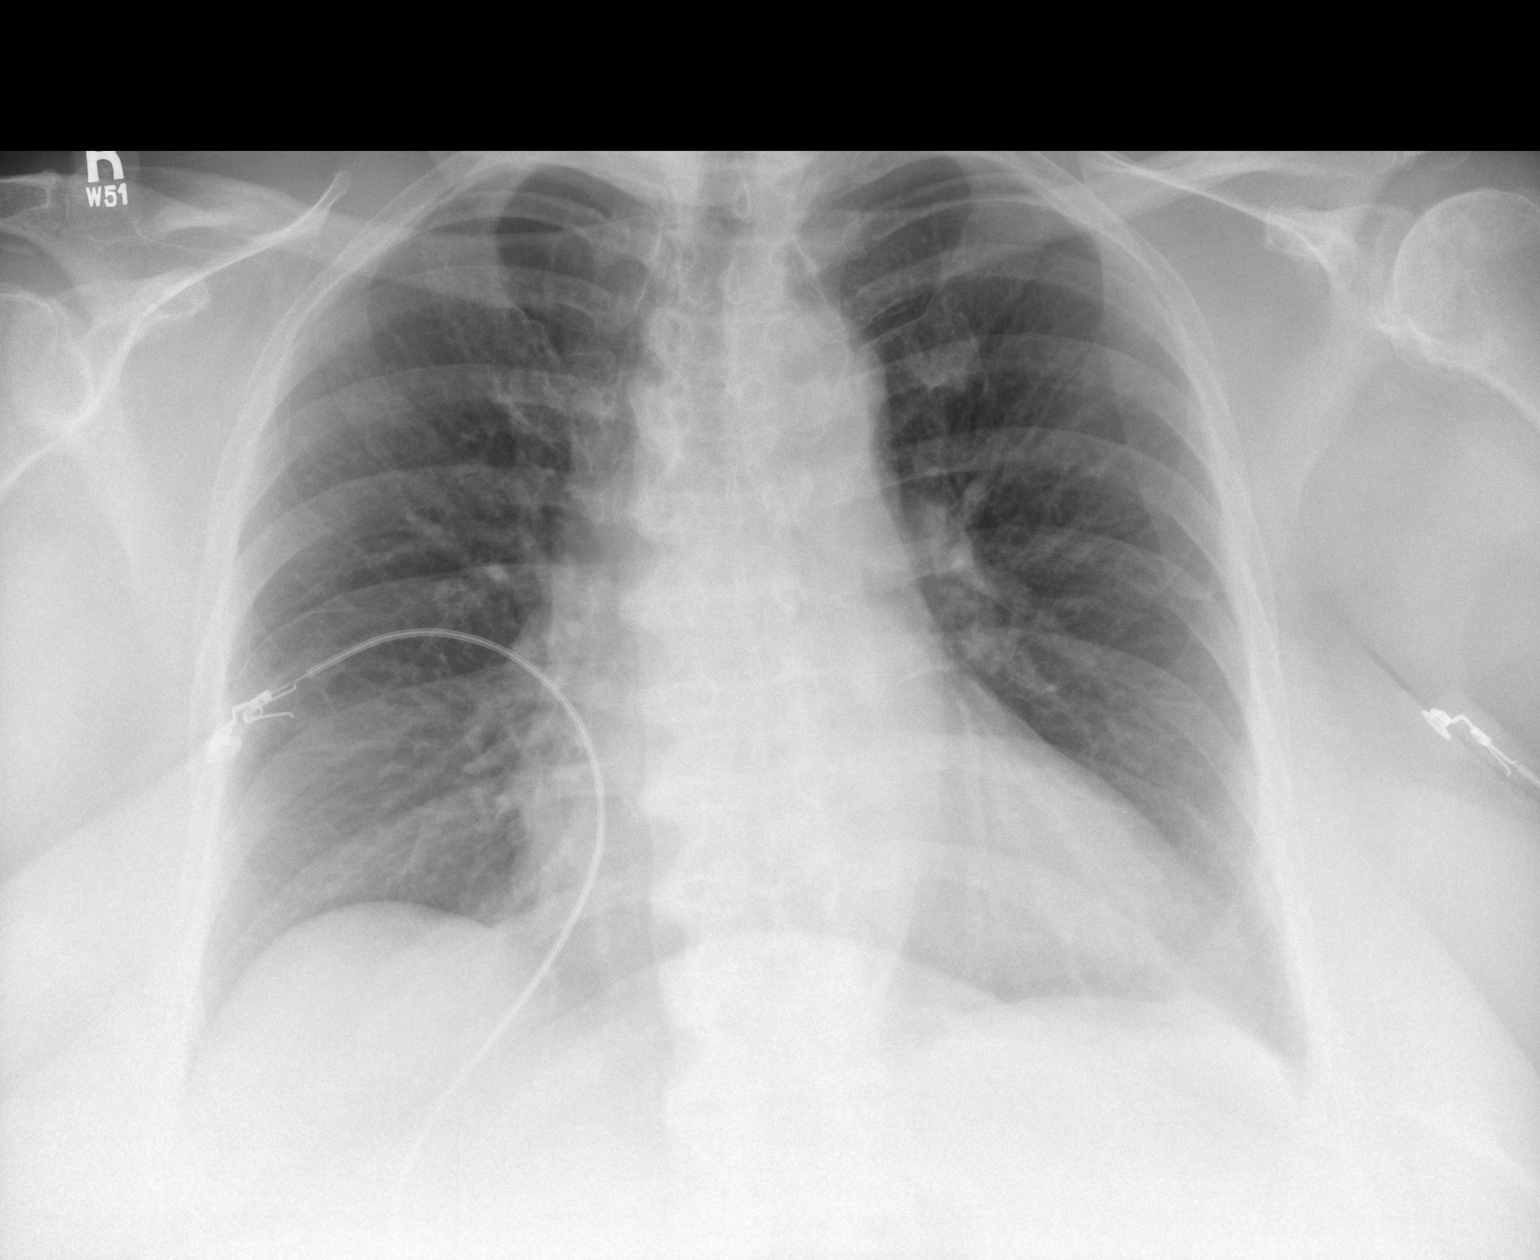

[1 of 1 positions shown; findings below may reference images not displayed]

FINDINGS: Mild aortic ectasia. Cardiac silhouette is not enlarged. No aggressive bone lesions are seen. Lungs are symmetrically aerated and clear.
IMPRESSION: 
IMPRESSION: Aortic ectasia with no focal finding

## 2023-07-13 IMAGING — CT CT CHEST PULMONARY EMBOLISM WITH IV CONTRAST
2 of 6 series · 19 of 36 positions shown · IV contrast (agent unspecified)
Comparison: 01/22/2020 chest CTA, same day chest radiograph.

Chest pain
FINAL REPORT:
EXAM: CT CHEST PULMONARY EMBOLISM WITH IV CONTRAST
INDICATION: Pulmonary embolism (PE) suspected, positive D-dimer. Chest pain.
TECHNIQUE: CTA chest with intravenous contrast. Coronal reformations were performed. 3-D/MIP images were also performed. CT scans at this facility use of iterative reconstruction technique, dose modulation and/or weight-based dosing when appropriate to reduce radiation dose to as low as reasonably achievable.

[Series 4: pe chest thin · axial · 0.91mm/px · z∈[-294,+56]mm · 16 of 636 slices shown]
[im 38/636  lung]
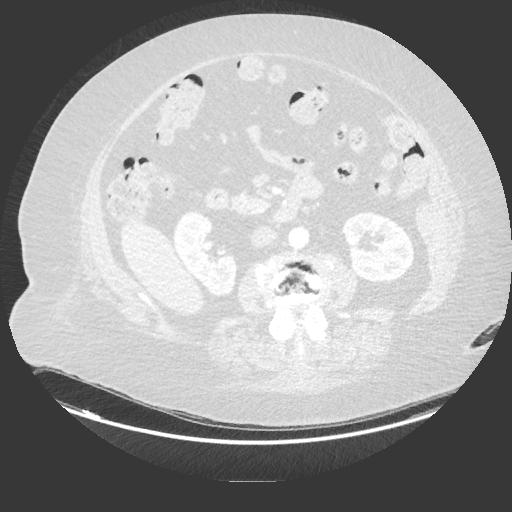
[im 75/636  mediastinal]
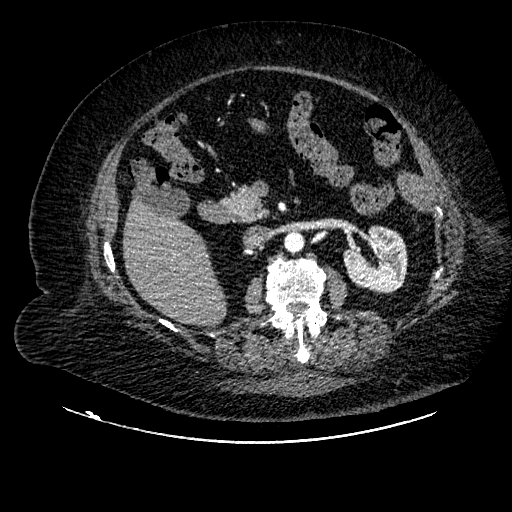
[im 113/636  lung]
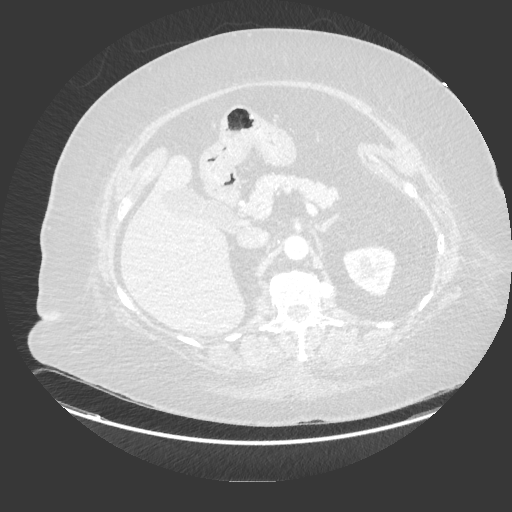
[im 150/636  mediastinal]
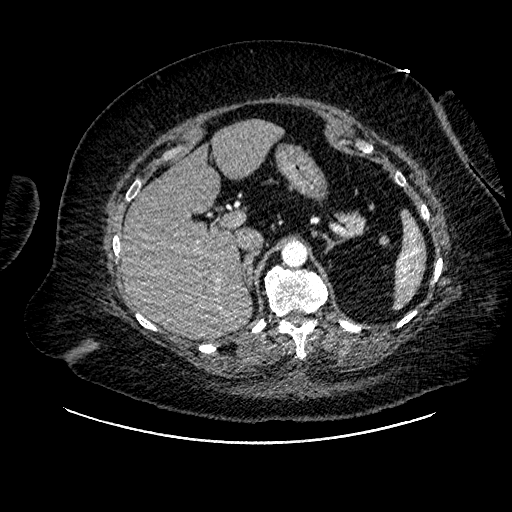
[im 187/636  lung]
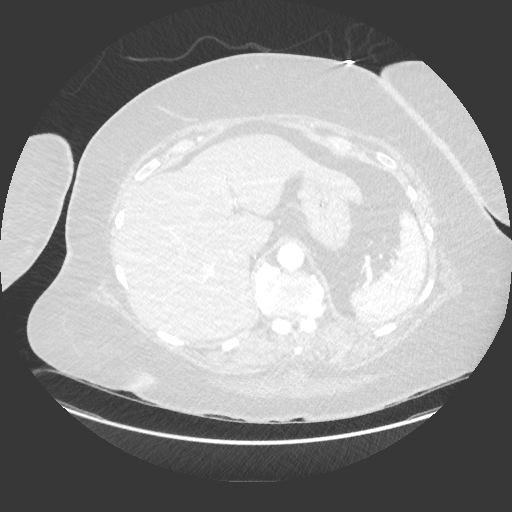
[im 225/636  mediastinal]
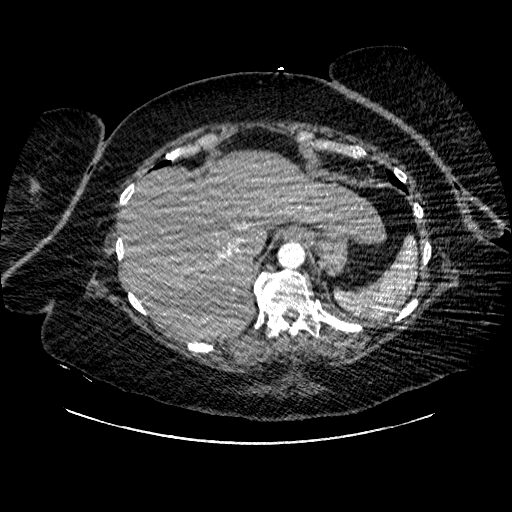
[im 262/636  lung]
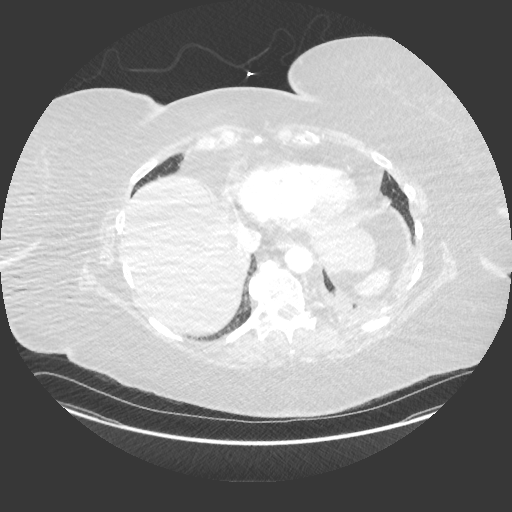
[im 299/636  mediastinal]
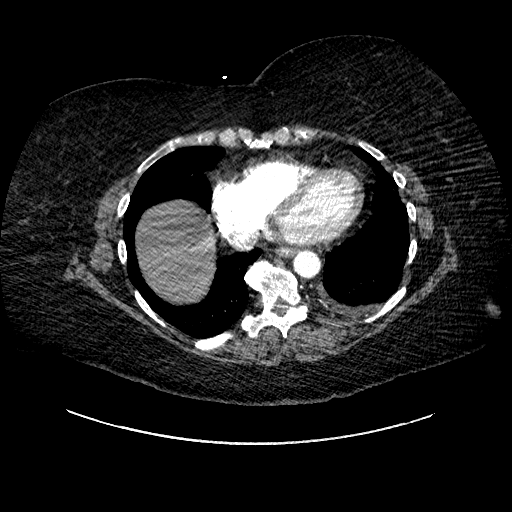
[im 337/636  lung]
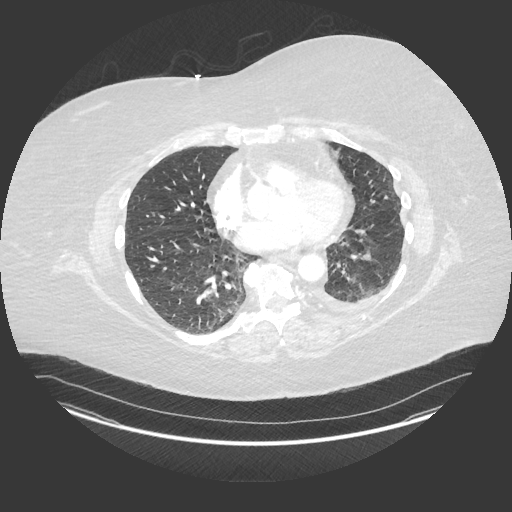
[im 374/636  mediastinal]
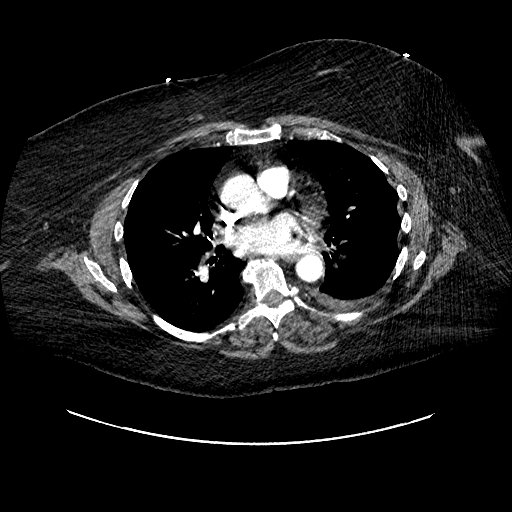
[im 411/636  lung]
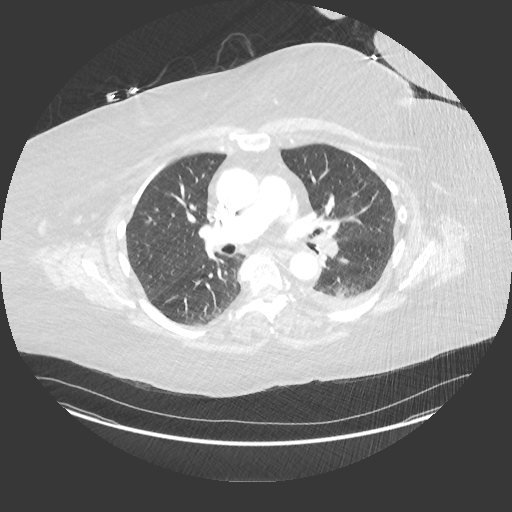
[im 449/636  mediastinal]
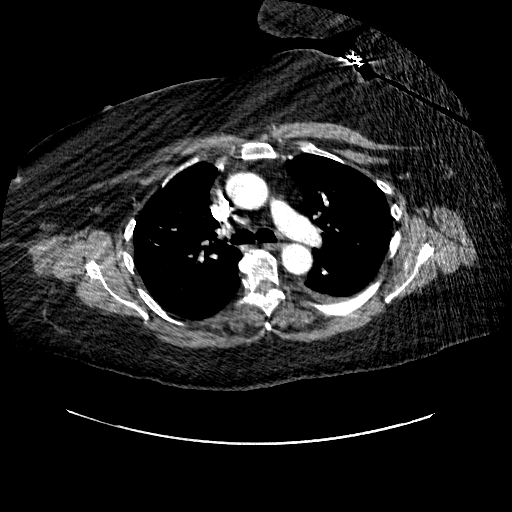
[im 486/636  lung]
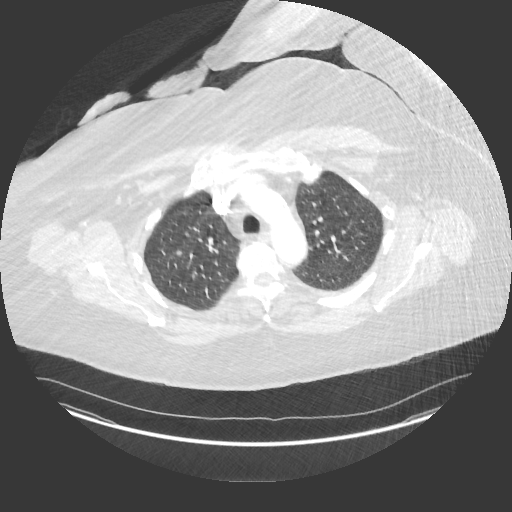
[im 523/636  mediastinal]
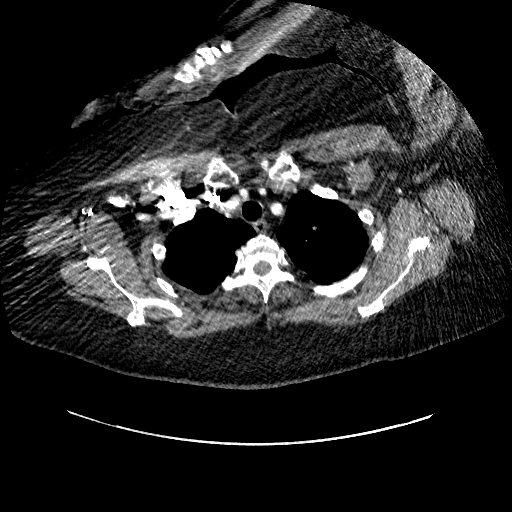
[im 561/636  lung]
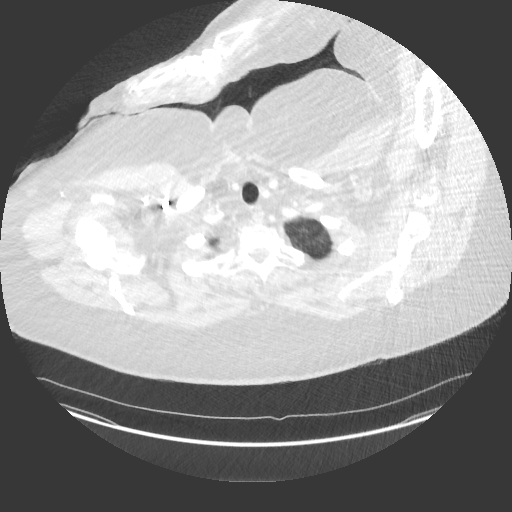
[im 598/636  mediastinal]
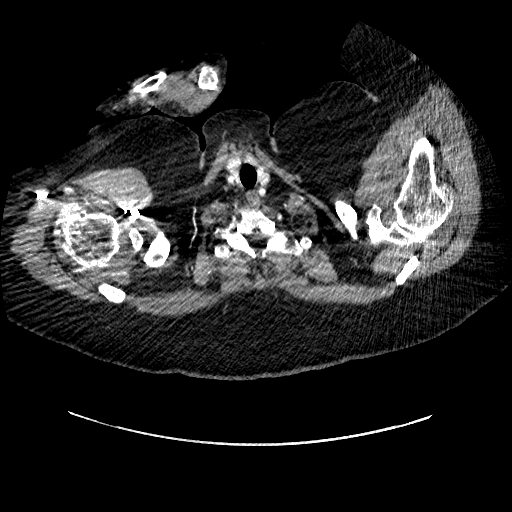

[Series 601: sag standard 2x2 · sagittal · 0.91mm/px · 3 of 233 slices shown]
[im 47/233  mediastinal]
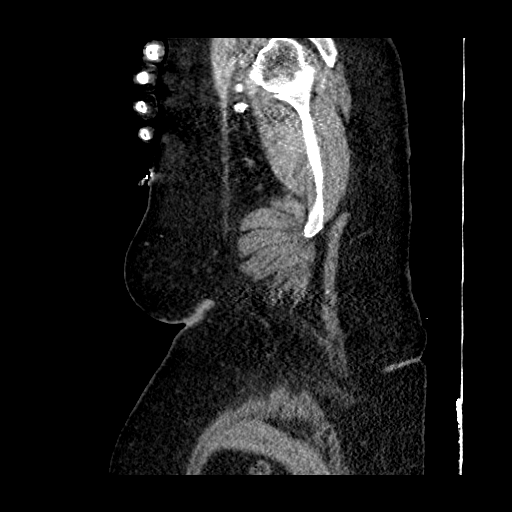
[im 93/233  mediastinal]
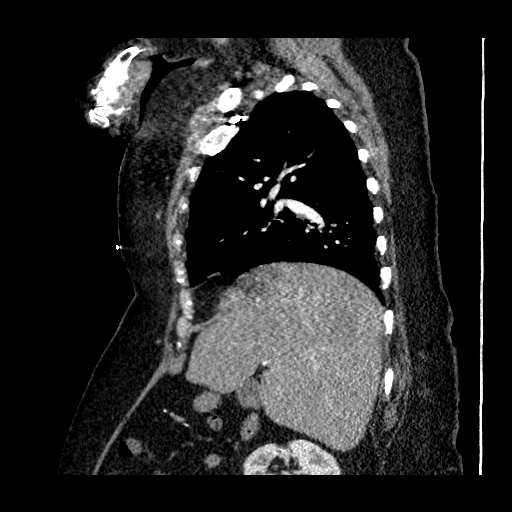
[im 140/233  mediastinal]
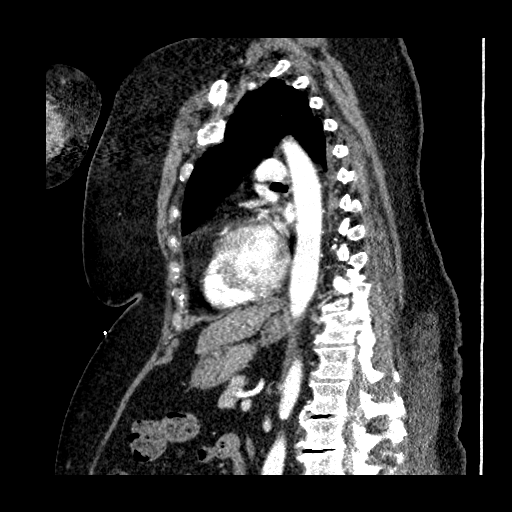

[19 of 36 positions shown; findings below may reference images not displayed]

FINDINGS: Mild bilateral dependent atelectasis. Mild pulmonary vascular congestion. There is a trace left pleural effusion. No pneumothorax.
There is a filling defect in the proximal left lower lobar pulmonary artery extending into the segmental branches (axial 54). Additionally, there is a filling defect in the right lower lobar pulmonary artery (axial 65). No evidence of right-sided heart strain. No lung infarct.
No thoracic aortic aneurysm or dissection. Coronary artery calcifications are absent.
No pathologically enlarged lymph nodes in the thorax.
Heart is borderline enlarged. No pericardial effusion.
Visualized upper abdomen demonstrates no suspicious finding.
Osseous structures demonstrate no acute fracture. Age-related degenerative changes are present.
IMPRESSION: 1.  Acute bilateral pulmonary emboli involving the bilateral lower lobar pulmonary arteries left greater than right. No evidence of right-sided heart strain. No lung infarct.
2.  Mild pulmonary vascular congestion. Trace left pleural effusion.
A Red - High message has been communicated to TAGALSIR, QUEENITA via the [employer] Actionable Findings application on 07/13/2023 [DATE], Message ID [id number].

## 2023-07-19 IMAGING — CT CT HEAD WITHOUT CONTRAST
3 of 4 series · 16 of 47 positions shown, 19 images · non-contrast
Comparison: None.
COMPARISON: None.

------------- REPORT [id number] -------------
FINAL REPORT:
EXAM: CT Head without contrast.
INDICATION: Acute post-traumatic headache, not intractable
Unspecified injury of head, initial encounter
Long term (current) use of anticoagulants. . .
TECHNIQUE: Contiguous axial images were obtained through the head without intravenous contrast. All CT scans at this facility use dose modulation and/or weight based dosing when appropriate to reduce radiation dose to as low as reasonably achievable.

[Series 302: thins · axial · 0.49mm/px · z∈[+1,+163]mm · 10 of 288 slices shown, 13 images]
[im 23/288  brain]
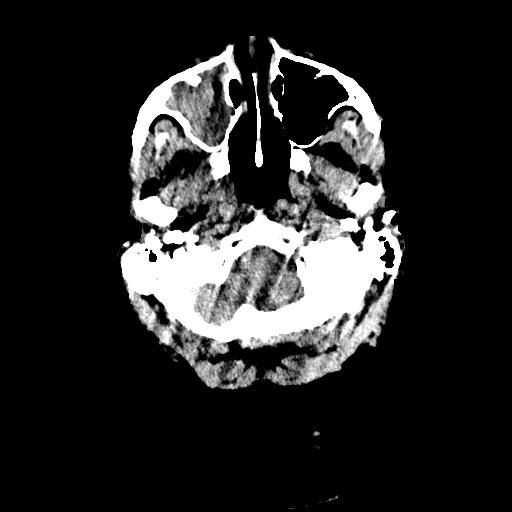
[im 23/288  bone]
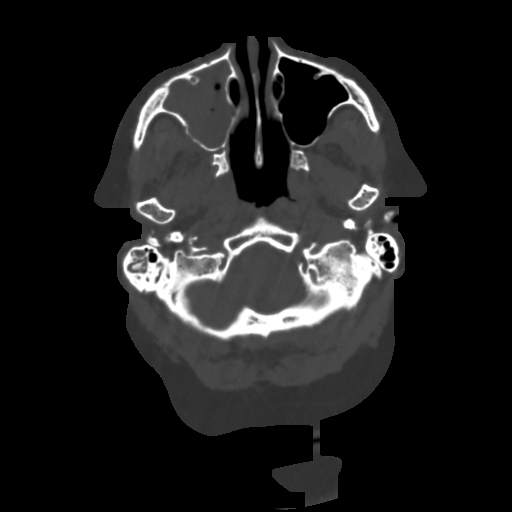
[im 45/288  brain]
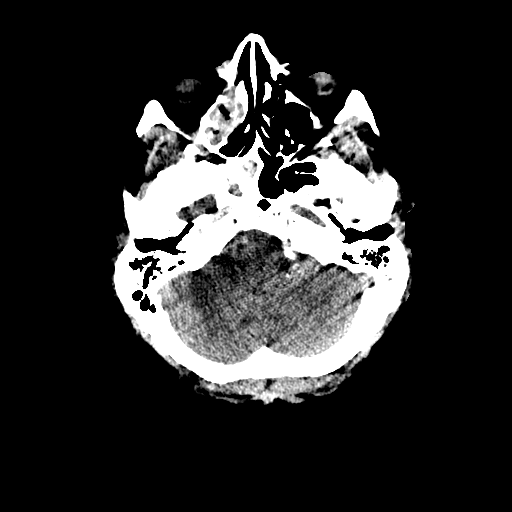
[im 78/288  brain]
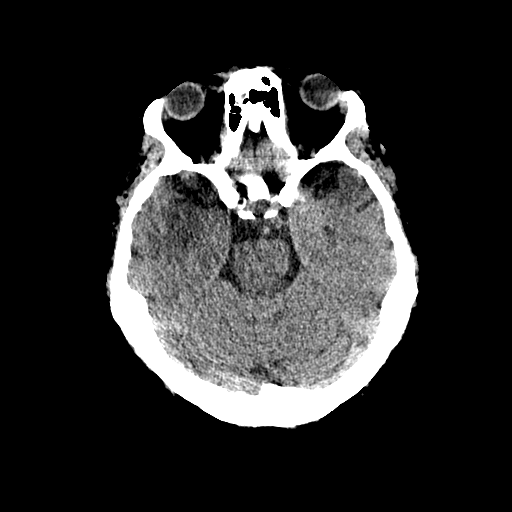
[im 100/288  brain]
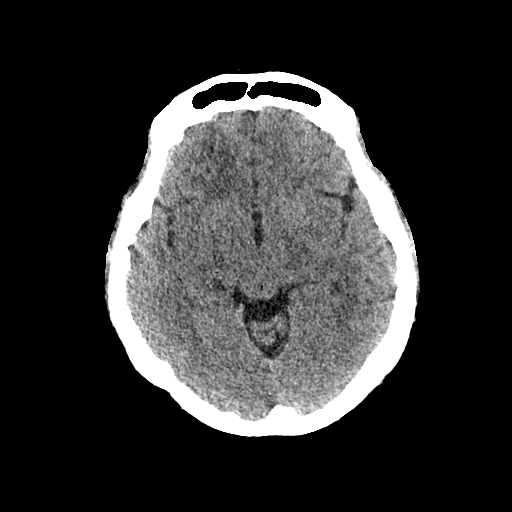
[im 133/288  brain]
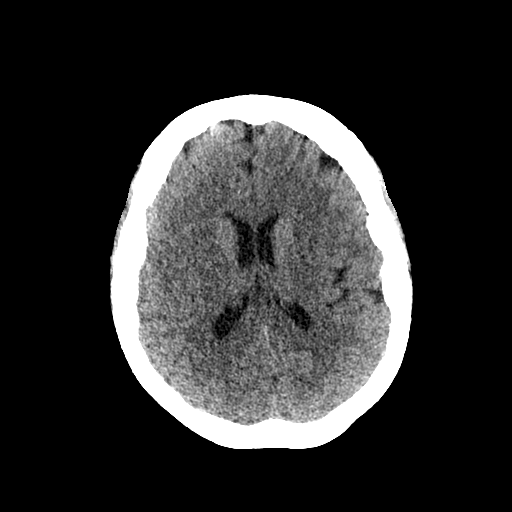
[im 133/288  bone]
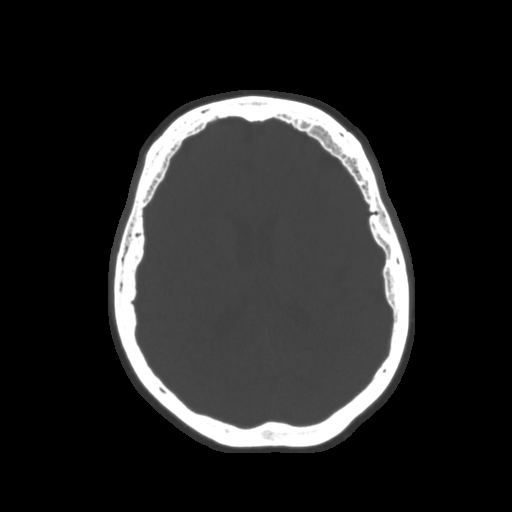
[im 155/288  brain]
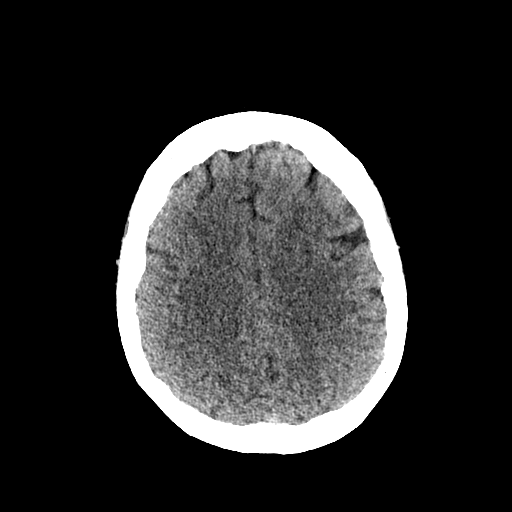
[im 188/288  brain]
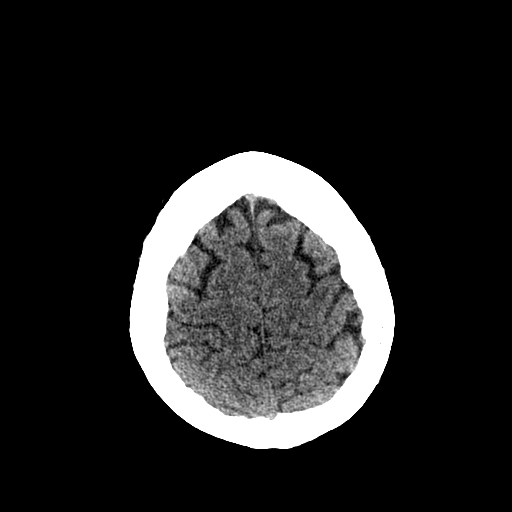
[im 210/288  brain]
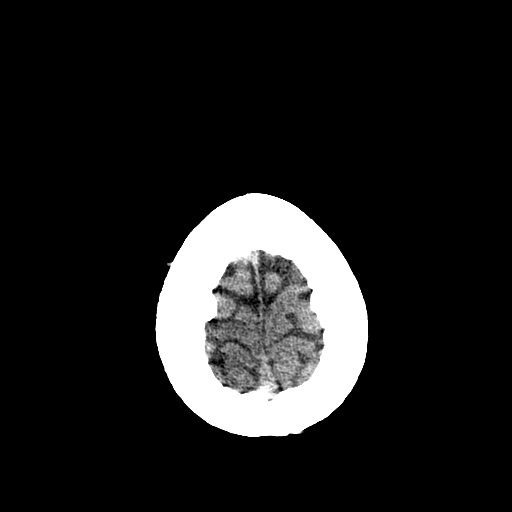
[im 243/288  brain]
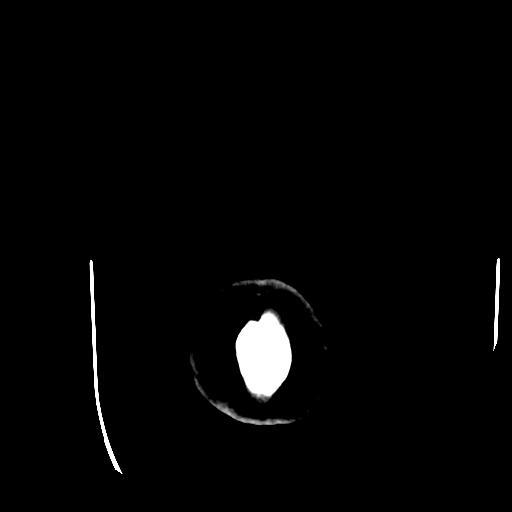
[im 243/288  bone]
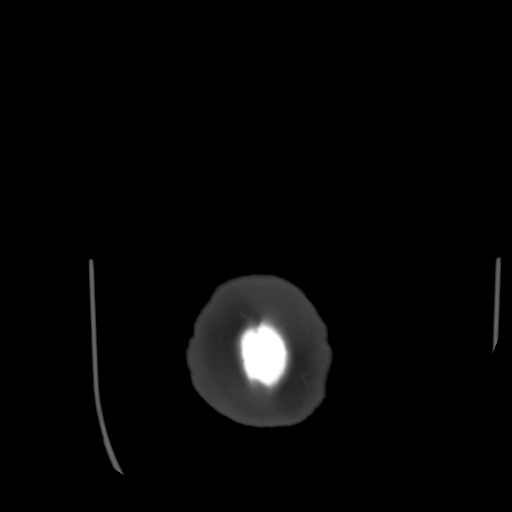
[im 265/288  brain]
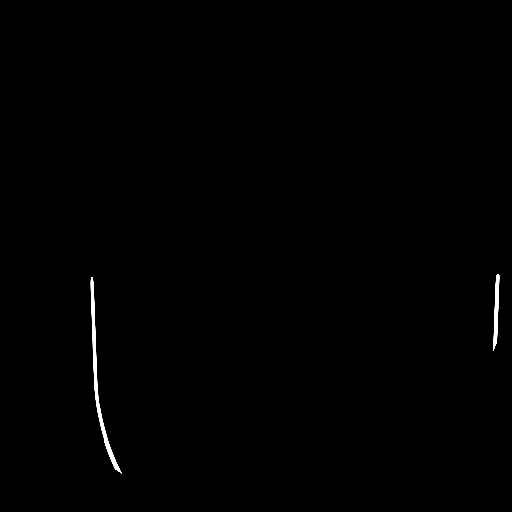

[Series 601: coronal · coronal · 0.49mm/px · 3 of 117 slices shown]
[im 39/117  brain]
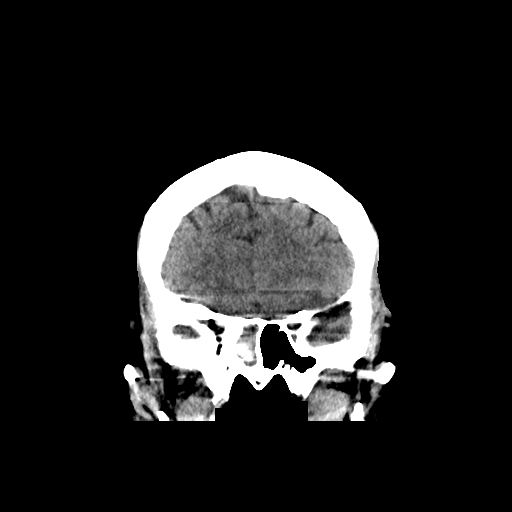
[im 52/117  brain]
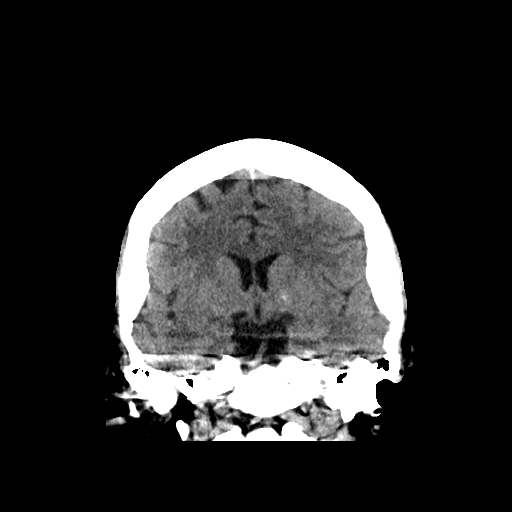
[im 65/117  brain]
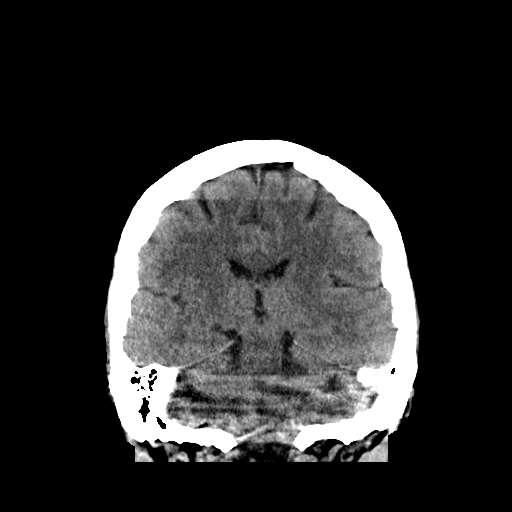

[Series 602: sagittal · sagittal · 0.49mm/px · 3 of 119 slices shown]
[im 40/119  brain]
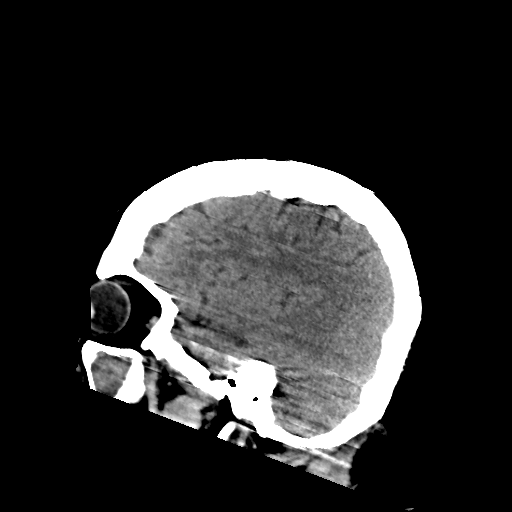
[im 60/119  brain]
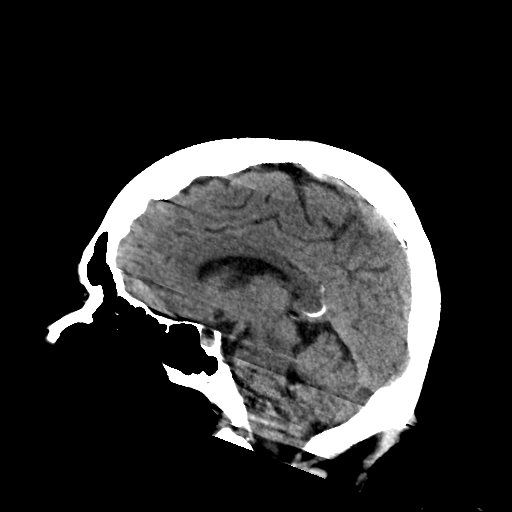
[im 79/119  brain]
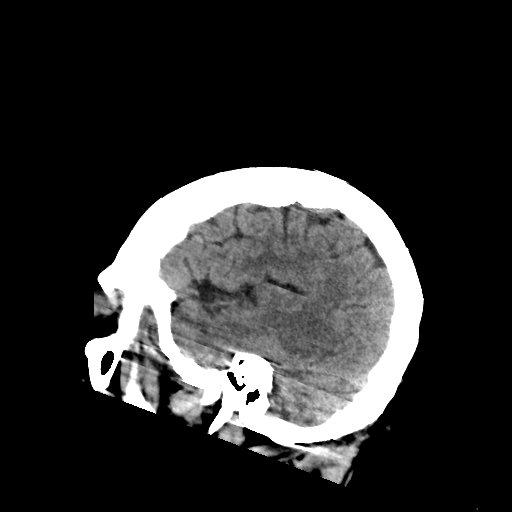

[16 of 47 positions shown; findings below may reference images not displayed]

FINDINGS: Basal cisterns are patent. No midline shift.  Ventricles are normal and symmetric.
No acute intracranial hemorrhage or large vessel base transcortical infarction. No extra-axial fluid collections.
No fractures. Complete opacification the right maxillary sinus with density gas. Complete opacification the right sphenoid sinus. Mild patchy opacities in the ethmoid air cells. Mastoid air cells are clear.
IMPRESSION: 1. No acute intracranial abnormalities.
2. Findings otherwise are consistent with right maxillary and right sphenoid chronic sinusitis.

------------- REPORT [id number] -------------
FINAL REPORT:
EXAM: CT Head without contrast.
FINDINGS: Basal cisterns are patent. No midline shift.  Ventricles are normal and symmetric.
No acute intracranial hemorrhage or large vessel base transcortical infarction. No extra-axial fluid collections.
No fractures. Complete opacification the right maxillary sinus with density gas. Complete opacification the right sphenoid sinus. Mild patchy opacities in the ethmoid air cells. Mastoid air cells are clear.
IMPRESSION: 1. No acute intracranial abnormalities.
2. Findings otherwise are consistent with right maxillary and right sphenoid chronic sinusitis.

## 2023-07-26 ENCOUNTER — Other Ambulatory Visit: Payer: Self-pay | Admitting: Primary Care

## 2023-07-26 ENCOUNTER — Telehealth: Payer: Self-pay | Admitting: Primary Care

## 2023-07-26 DIAGNOSIS — Z0184 Encounter for antibody response examination: Secondary | ICD-10-CM

## 2023-07-26 DIAGNOSIS — Z1211 Encounter for screening for malignant neoplasm of colon: Secondary | ICD-10-CM

## 2023-07-26 NOTE — Telephone Encounter (Signed)
 Patient will to make an appointment for a physical and would like to complete any lab work and colonoscopy (if needed) before hand.  Previous ordered labs have expired.    Please advise

## 2023-07-27 NOTE — Telephone Encounter (Signed)
 Called patient and reviewed message. She will have to check at work to see when she can schedule a physical.she was disappointed labs weren't ordered now, but explained will be time to coordinate with physical date.  Started to discuss GI referral but patient ended the conversation.

## 2023-07-27 NOTE — Telephone Encounter (Signed)
 Patient called again about update on labs.

## 2023-07-27 NOTE — Telephone Encounter (Signed)
 Labs will be ordered to coincide with physical, so labs can be discussed at physical. Once physical has been scheduled, let me know date.     I referred patient to GI for colonoscopy in 2023, 2021, 2018.   New referral done to [facility], [phone]

## 2023-09-12 ENCOUNTER — Encounter: Payer: Self-pay | Admitting: Primary Care

## 2023-09-13 ENCOUNTER — Encounter: Payer: Self-pay | Admitting: Gastroenterology

## 2023-10-25 ENCOUNTER — Other Ambulatory Visit: Payer: Self-pay

## 2023-10-25 ENCOUNTER — Ambulatory Visit: Payer: PRIVATE HEALTH INSURANCE | Attending: Primary Care

## 2023-10-25 VITALS — BP 116/68 | HR 84 | Temp 97.3°F | Resp 16 | Ht 65.0 in | Wt 205.0 lb

## 2023-10-25 DIAGNOSIS — B351 Tinea unguium: Secondary | ICD-10-CM

## 2023-10-25 DIAGNOSIS — R7303 Prediabetes: Secondary | ICD-10-CM

## 2023-10-25 DIAGNOSIS — Z8619 Personal history of other infectious and parasitic diseases: Secondary | ICD-10-CM

## 2023-10-25 DIAGNOSIS — Z0000 Encounter for general adult medical examination without abnormal findings: Secondary | ICD-10-CM
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
## 2023-10-26 ENCOUNTER — Other Ambulatory Visit
Admission: RE | Admit: 2023-10-26 | Discharge: 2023-10-26 | Disposition: A | Payer: PRIVATE HEALTH INSURANCE | Source: Ambulatory Visit

## 2023-10-26 ENCOUNTER — Ambulatory Visit: Payer: Self-pay

## 2023-10-26 DIAGNOSIS — B351 Tinea unguium: Secondary | ICD-10-CM | POA: Insufficient documentation

## 2023-10-26 DIAGNOSIS — Z0000 Encounter for general adult medical examination without abnormal findings: Secondary | ICD-10-CM | POA: Insufficient documentation

## 2023-10-26 DIAGNOSIS — Z8619 Personal history of other infectious and parasitic diseases: Secondary | ICD-10-CM | POA: Insufficient documentation

## 2023-10-26 LAB — COMPREHENSIVE METABOLIC PANEL
ALT: 17 U/L (ref 0–35)
AST: 22 U/L (ref 0–35)
Albumin: 4 g/dL (ref 3.5–5.2)
Alk Phos: 78 U/L (ref 35–105)
Anion Gap: 13 (ref 7–16)
Bilirubin,Total: 0.4 mg/dL (ref 0.0–1.2)
CO2: 22 mmol/L (ref 20–28)
Calcium: 9.4 mg/dL (ref 8.6–10.2)
Chloride: 107 mmol/L (ref 96–108)
Creatinine: 0.87 mg/dL (ref 0.51–0.95)
Glucose: 85 mg/dL (ref 60–99)
Lab: 9 mg/dL (ref 6–20)
Potassium: 4.4 mmol/L (ref 3.3–5.1)
Sodium: 142 mmol/L (ref 133–145)
Total Protein: 7.5 g/dL (ref 6.3–7.7)
eGFR BY CREAT: 73

## 2023-10-26 LAB — CBC AND DIFFERENTIAL
Baso # K/uL: 0 THOU/uL (ref 0.0–0.2)
Eos # K/uL: 0.1 THOU/uL (ref 0.0–0.5)
Hematocrit: 41 % (ref 34–49)
Hemoglobin: 12.9 g/dL (ref 11.2–16.0)
IMM Granulocytes #: 0 THOU/uL (ref 0–0)
IMM Granulocytes: 0.2 %
Lymph # K/uL: 2.6 THOU/uL (ref 1.0–5.0)
MCV: 86 fL (ref 75–100)
Mono # K/uL: 0.4 THOU/uL (ref 0.1–1.0)
Neut # K/uL: 1.8 THOU/uL (ref 1.5–6.5)
Nucl RBC # K/uL: 0 THOU/uL
Nucl RBC %: 0 /100{WBCs} (ref 0.0–0.2)
Platelets: 208 THOU/uL (ref 150–450)
RBC: 4.8 MIL/uL (ref 4.0–5.5)
RDW: 14.3 % (ref 0.0–15.0)
Seg Neut %: 36 %
WBC: 4.9 THOU/uL (ref 3.5–11.0)

## 2023-10-26 LAB — LIPID PANEL
Chol/HDL Ratio: 5.3
Cholesterol: 227 mg/dL — AB
HDL: 43 mg/dL (ref 40–60)
LDL Calculated: 170 mg/dL — AB
Non HDL Cholesterol: 184 mg/dL
Triglycerides: 80 mg/dL

## 2023-10-26 LAB — HEPATITIS A,B,C,PANEL
HBV Core Ab: POSITIVE
HBV S Ab Quant: >1000 m[IU]/mL
HBV S Ab: POSITIVE
HBV S Ag: NEGATIVE
Hep C Ab: NEGATIVE
Hepatitis A IGG: POSITIVE

## 2023-10-26 LAB — VITAMIN D: 25-OH Vit Total: 23 ng/mL — ABNORMAL LOW (ref 30–60)

## 2023-10-26 LAB — TSH: TSH: 1.08 u[IU]/mL (ref 0.27–4.20)

## 2023-10-26 LAB — HEMOGLOBIN A1C: Hemoglobin A1C: 6.3 % — ABNORMAL HIGH (ref <=5.6)

## 2023-10-26 MED ORDER — TERBINAFINE HCL 250 MG PO TABS *I*
250.0000 mg | ORAL_TABLET | Freq: Every day | ORAL | 0 refills | Status: DC
Start: 2023-10-26 — End: 2024-01-24

## 2023-10-29 NOTE — Telephone Encounter (Signed)
Addressed in separate MyChart message.

## 2023-10-30 LAB — HM COLONOSCOPY

## 2023-11-01 ENCOUNTER — Telehealth: Payer: Self-pay | Admitting: Primary Care

## 2023-11-01 DIAGNOSIS — B351 Tinea unguium: Secondary | ICD-10-CM

## 2023-11-01 MED ORDER — TERBINAFINE HCL 250 MG PO TABS *I*
250.0000 mg | ORAL_TABLET | Freq: Every day | ORAL | 0 refills | Status: AC
Start: 2023-11-01 — End: 2024-01-30

## 2023-11-01 NOTE — Telephone Encounter (Signed)
 Received fax from pharmacy. Prior authorization required for Terbinafine. Forwarding fax to PA team.

## 2023-11-13 ENCOUNTER — Telehealth: Payer: Self-pay | Admitting: Primary Care

## 2023-11-13 NOTE — Telephone Encounter (Signed)
 Patient terbinafine tablets has been deniedShe does not have access for the reason. [place] we could call [phone] if we had any questions regarding this.

## 2023-11-20 ENCOUNTER — Encounter: Payer: Self-pay | Admitting: Primary Care

## 2023-12-24 ENCOUNTER — Other Ambulatory Visit: Payer: Self-pay | Admitting: Physician Assistant

## 2024-01-11 ENCOUNTER — Encounter: Payer: Self-pay | Admitting: Primary Care

## 2024-01-14 NOTE — Telephone Encounter (Signed)
 Sending to Sam, as this was a chest pain? From last week.

## 2024-01-15 NOTE — Telephone Encounter (Signed)
 Pt calling back to reschedule appt that she cancelled on 12/29. She notes the chest pain and muscle soreness has returned. Appt scheduled 12/31

## 2024-01-15 NOTE — Telephone Encounter (Signed)
 Attempted to call patient to get more information. Left detailed message recommending to call on call provider with any concerns or to go to ED if necessary if symptoms unable to wait until tomorrows scheduled appt.

## 2024-01-16 ENCOUNTER — Ambulatory Visit
Admission: RE | Admit: 2024-01-16 | Discharge: 2024-01-16 | Disposition: A | Payer: PRIVATE HEALTH INSURANCE | Source: Ambulatory Visit

## 2024-01-16 ENCOUNTER — Other Ambulatory Visit: Payer: Self-pay | Admitting: Gastroenterology

## 2024-01-16 ENCOUNTER — Other Ambulatory Visit: Payer: Self-pay

## 2024-01-16 ENCOUNTER — Ambulatory Visit
Admission: RE | Admit: 2024-01-16 | Discharge: 2024-01-16 | Disposition: A | Discharge status: Home / Self Care | Payer: PRIVATE HEALTH INSURANCE | Source: Ambulatory Visit | Attending: Primary Care | Admitting: Primary Care

## 2024-01-16 ENCOUNTER — Ambulatory Visit: Payer: PRIVATE HEALTH INSURANCE | Admitting: Primary Care

## 2024-01-16 ENCOUNTER — Other Ambulatory Visit
Admission: RE | Admit: 2024-01-16 | Discharge: 2024-01-16 | Disposition: A | Discharge status: Home / Self Care | Payer: PRIVATE HEALTH INSURANCE | Source: Ambulatory Visit | Attending: Primary Care | Admitting: Primary Care

## 2024-01-16 VITALS — BP 118/82 | HR 82 | Temp 97.3°F | Resp 20 | Wt 205.8 lb

## 2024-01-16 DIAGNOSIS — R0789 Other chest pain: Secondary | ICD-10-CM

## 2024-01-16 DIAGNOSIS — R109 Unspecified abdominal pain: Secondary | ICD-10-CM

## 2024-01-16 DIAGNOSIS — R079 Chest pain, unspecified: Secondary | ICD-10-CM

## 2024-01-16 DIAGNOSIS — K449 Diaphragmatic hernia without obstruction or gangrene: Secondary | ICD-10-CM

## 2024-01-16 LAB — CBC AND DIFFERENTIAL
Baso # K/uL: 0 THOU/uL (ref 0.0–0.2)
Eos # K/uL: 0.2 THOU/uL (ref 0.0–0.5)
Hematocrit: 40 % (ref 34–49)
Hemoglobin: 12.3 g/dL (ref 11.2–16.0)
IMM Granulocytes #: 0 THOU/uL (ref 0–0)
IMM Granulocytes: 0.1 %
Lymph # K/uL: 2.6 THOU/uL (ref 1.0–5.0)
MCV: 86 fL (ref 75–100)
Mono # K/uL: 0.6 THOU/uL (ref 0.1–1.0)
Neut # K/uL: 3.8 THOU/uL (ref 1.5–6.5)
Nucl RBC # K/uL: 0 THOU/uL
Nucl RBC %: 0 /100{WBCs} (ref 0.0–0.2)
Platelets: 304 THOU/uL (ref 150–450)
RBC: 4.7 MIL/uL (ref 4.0–5.5)
RDW: 14.5 % (ref 0.0–15.0)
Seg Neut %: 52 %
WBC: 7.3 THOU/uL (ref 3.5–11.0)

## 2024-01-16 LAB — AMYLASE: Amylase: 68 U/L (ref 28–100)

## 2024-01-16 LAB — LIPASE: Lipase: 27 U/L (ref 13–60)

## 2024-01-16 LAB — H. PYLORI ANTIGEN, STOOL

## 2024-01-16 MED ORDER — OMEPRAZOLE 40 MG PO CPDR *I*: 40.0000 mg | DELAYED_RELEASE_CAPSULE | Freq: Every day | ORAL | 1 refills | Status: DC

## 2024-01-16 NOTE — Patient Instructions (Signed)
 Get xray and labs doneStart OmeprazoleWill arrange for echocardiogram

## 2024-01-16 NOTE — Progress Notes (Unsigned)
 [facility] GroupOutpatient Visit NoteHistory of Present Illness Pamela Mccoy is a 68 y.o. year old female who presents today for: Episode Mid epi disc depe on food, pot salad.Took tums few nights One am, acr chest hurted took ibup ?Yest up/down str, sob baem stairsOut on leaveYounger Last bm, yest am Dec appTight Yest recurr,Last pm, Light housekeeping GerdNo heavy liftingRedi pain Wt Readings from Last 3 Encounters: 01/16/24 93.4 kg (205 lb 12.8 oz) 10/25/23 93 kg (205 lb) 09/15/22 87.5 kg (193 lb) Fhx. Brother 21 m1 william  ckd, dialy 1 died , prlonged cad richard 3 living broth1 died young Past Medical History:Past Medical History[1]Past Surgical History:Past Surgical History[2]Family History:Family History[3]Allergy: Reviewed with patient at time of visitAllergies[4]Medications:Reviewed with patient at time of visitCurrent Medications[5] Social History:Reviewed with patient at time of visitSocial History Tobacco Use  Smoking status: Never  Smokeless tobacco: Never Substance Use Topics  Alcohol use: Yes   Comment: 1/week Review of Systems:General ROS: negative for - chills or feverRespiratory ROS: no cough, shortness of breath, or wheezingCardiovascular ROS: no chest pain or dyspnea on exertionGastrointestinal ROS: no abdominal pain, change in bowel habitsNeurological ROS: no dizzinessPsychological ROS: Physical Exam: Vitals:  01/16/24 1204 BP: 118/82 BP Location: Right arm Patient Position: Sitting Cuff Size: adult Pulse: 82 Resp: 20 Temp: 36.3 C (97.3 F) SpO2: 100% Weight: 93.4 kg (205 lb 12.8 oz) Body mass index is 34.25 kg/m.Wt Readings from Last 3 Encounters: 01/16/24 93.4 kg (205 lb 12.8 oz) 10/25/23 93 kg (205 lb) 09/15/22 87.5 kg (193 lb) GENERAL APPEARANCE:  Well appearing, NADHEENT:  EOMI.  Anicteric sclera. Neck supple no adenopathy or thyromegaly, no carotid bruitsLUNGS: Clear to auscultation bilaterally. HEART: RRR, Normal S1,S2 without murmurs, gallops, or rubs. ABDOMEN: Soft, active bowel sounds, non tender. No masses or organomegalyEXTREMITIES: Without clubbing, cyanosis, or edema. Distal pulses 2+NEUROLOGIC: Alert and oriented x 3, grossly nonfocal. PSYCH: Normal mood and affect. Recent Lab Results:Lab Results Component Value Date  NA 142 10/26/2023  K 4.4 10/26/2023  CL 107 10/26/2023  CO2 22 10/26/2023  UN 9 10/26/2023  CREAT 0.87 10/26/2023  VID25 23 (L) 10/26/2023  WBC 4.9 10/26/2023  HGB 12.9 10/26/2023  HCT 41 10/26/2023  PLT 208 10/26/2023  TSH 1.08 10/26/2023  CHOL 227 (!) 10/26/2023  TRIG 80 10/26/2023  HDL 43 10/26/2023  LDLC 170 (!) 10/26/2023  CHHDC 5.3 10/26/2023 Lab Results Component Value Date  HA1C 6.3 (H) 10/26/2023 Assessment and Plan: No diagnosis found.Marilee Ditommaso L Hulon Ferron, MD [1] Past Medical History:Diagnosis Date  Hepatitis B   Hyperlipidemia  [2] Past Surgical History:Procedure Laterality Date  CESAREAN SECTION, CLASSIC    x 1  CESAREAN SECTION, CLASSIC    Cesarean Section Conversion Data  [3] Family HistoryProblem Relation Name Age of Onset  Cancer Mother    Cancer Father    Breast cancer Maternal Aunt   [4] AllergiesAllergen Reactions  Honey Anaphylaxis  Mango Anaphylaxis  Sunflower Oil Anaphylaxis [5] Current Outpatient Medications:   terbinafine  (LAMISIL ) 250 mg tablet, Take 1 tablet (250 mg total) by mouth daily for Fungal Toenail Disease., Disp: 90 tablet, Rfl: 0

## 2024-01-17 ENCOUNTER — Encounter: Payer: Self-pay | Admitting: Primary Care

## 2024-01-18 ENCOUNTER — Other Ambulatory Visit
Admission: RE | Admit: 2024-01-18 | Discharge: 2024-01-18 | Disposition: A | Discharge status: Home / Self Care | Payer: PRIVATE HEALTH INSURANCE | Source: Ambulatory Visit | Attending: Primary Care | Admitting: Primary Care

## 2024-01-18 ENCOUNTER — Ambulatory Visit: Payer: Self-pay | Admitting: Primary Care

## 2024-01-18 ENCOUNTER — Encounter: Payer: Self-pay | Admitting: Gastroenterology

## 2024-01-18 ENCOUNTER — Telehealth: Payer: Self-pay | Admitting: Primary Care

## 2024-01-18 DIAGNOSIS — R109 Unspecified abdominal pain: Secondary | ICD-10-CM | POA: Insufficient documentation

## 2024-01-18 DIAGNOSIS — R0789 Other chest pain: Secondary | ICD-10-CM

## 2024-01-18 LAB — COMPREHENSIVE METABOLIC PANEL
ALT: 17 U/L (ref 0–35)
AST: 20 U/L (ref 0–35)
Albumin: 4.2 g/dL (ref 3.5–5.2)
Alk Phos: 76 U/L (ref 35–105)
Anion Gap: 21 — ABNORMAL HIGH (ref 7–16)
Bilirubin,Total: 0.3 mg/dL (ref 0.0–1.2)
CO2: 18 mmol/L — ABNORMAL LOW (ref 20–28)
Calcium: 9.2 mg/dL (ref 8.6–10.2)
Chloride: 104 mmol/L (ref 96–108)
Creatinine: 1.06 mg/dL — ABNORMAL HIGH (ref 0.51–0.95)
Glucose: 77 mg/dL (ref 60–99)
Lab: 12 mg/dL (ref 6–20)
Potassium: 4.4 mmol/L (ref 3.3–5.1)
Sodium: 143 mmol/L (ref 133–145)
Total Protein: 7.4 g/dL (ref 6.3–7.7)
eGFR BY CREAT: 58 — AB

## 2024-01-18 LAB — H. PYLORI ANTIGEN, STOOL: H. pylori Stool Antigen EIA: 0

## 2024-01-18 NOTE — Telephone Encounter (Addendum)
 Please inform patient that chest x-ray revealed no abnormalities in the lungs, did show the hiatal hernia that we are aware that is present.Abdominal films showed mild/moderate amount of stool, hopefully she has had regular bowel movements.Labs revealed normal liver tests, but kidney function was a bit impaired, could've been related to decreased input. How is she feeling? Any improvement in GI symptoms?

## 2024-01-18 NOTE — Telephone Encounter (Signed)
 Plan was to order a stress echo but as she had work related injury, walking on treadmill is likely limited, so will refer to Cardiology for input. Referral done and she should be contacted by Cardiology.

## 2024-01-18 NOTE — Telephone Encounter (Signed)
 Called Pamela Mccoy to relay information. She stated her G.I symptoms are decreasing, and her pain is better but still feeling chest pain.

## 2024-01-21 NOTE — H&P (Unsigned)
 [facility] Subjective:  Pamela Mccoy is a 69 y.o. female and is here for a comprehensive physical exam. Past Medical History[1]Past Surgical History[2]Family History[3]Social History[4]Current Outpatient Medications Medication Sig  omeprazole  (PRILOSEC) 40 mg capsule Take 1 capsule (40 mg total) by mouth daily.  terbinafine  (LAMISIL ) 250 mg tablet Take 1 tablet (250 mg total) by mouth daily for Fungal Toenail Disease. No current facility-administered medications for this visit. Allergies[5]Immunization History Administered Date(s) Administered  Covid-19 mRNA vaccine (PFIZER) IM 30 mcg/0.3mL 03/24/2019, 04/14/2019  PPD Test 01/25/2006, 05/22/2007  Tdap 05/25/2014  Yellow Fever 02/15/2023  Review of SystemsGeneral ROS: negative for - chills or feverEndocrine ROS: negative for -polydipsiaRespiratory ROS: no cough, shortness of breath, or wheezingCardiovascular ROS: no chest pain or dyspnea on exertionGastrointestinal ROS: no abdominal pain, change in bowel habits, or black or bloody stoolsNeurological ROS: no dizzinessPsychological ROS: negative for Objective: There were no vitals filed for this visit.There is no height or weight on file to calculate BMI.Wt Readings from Last 3 Encounters: 01/16/24 93.4 kg (205 lb 12.8 oz) 10/25/23 93 kg (205 lb) 09/15/22 87.5 kg (193 lb) General appearance: alert and no distressHEENT: Eyes: conjunctivae/corneas clear.  EOM's intact.Ears: normal TM's and external ear canals both ears. Throat: normal findings: lips normal without lesions. Neck: supple, no adenopathy, thyromegalyLymph: no supraclavicular, axillary adenopathyBreasts: normal appearance, non-tender, no masses Back: non tender Lungs: clear to auscultation bilaterallyHeart: regular rate and rhythm, S1, S2 normalAbdomen: soft, non-tender; bowel sounds normal; no masses,  no  organomegalyExtremities: extremities normal, atraumatic, no cyanosis or edemaPulses: 2+ and symmetricSkin: Skin color, texture, turgor normal. No rashes or lesionsMusk: no synovitis, Range of motion full. Neurologic: Alert and oriented X 3, normal strength and tone. Normal symmetric reflexes. Normal coordination and gaitPsych: normal mood and affect    Lab results: 12/31/251341 Sodium 143 Potassium 4.4 Chloride 104 CO2 18* UN 12 Creatinine 1.06* Glucose 77 Calcium 9.2 Total Protein 7.4 Albumin 4.2 ALT 17 AST 20 Alk Phos 76 Bilirubin,Total 0.3   Lab results: 12/31/251341 WBC 7.3 Hemoglobin 12.3 Hematocrit 40 RBC 4.7 Platelets 304   Lab results: 10/10/250808 25-OH Vit D Total 23*    Lab results: 10/10/250808 Cholesterol 227* HDL 43 LDL Calculated 170* Triglycerides 80 Non HDL Cholesterol 184 Chol/HDL Ratio 5.3   Lab results: 10/10/250808 TSH 1.08 Lab Results Component Value Date  HA1C 6.3 (H) 10/26/2023 Assessment/PlanNo diagnosis found.DexaShingBRENDA LITTIE MOATS, MD [1] Past Medical History:Diagnosis Date  Hepatitis B   Hyperlipidemia  [2] Past Surgical History:Procedure Laterality Date  CESAREAN SECTION, CLASSIC    x 1  CESAREAN SECTION, CLASSIC    Cesarean Section Conversion Data  [3] Family HistoryProblem Relation Name Age of Onset  Cancer Mother    Cancer Father    Coronary artery disease Brother Richar   Coronary artery disease Brother Elsie   Kidney Disease Brother Elsie   Breast cancer Maternal Aunt   [4] Social HistorySocioeconomic History  Marital status: Widowed Tobacco Use  Smoking status: Never  Smokeless tobacco: Never Substance and Sexual Activity  Alcohol use: Yes   Comment: 1/week  Drug use: No  Sexual activity: Never   Partners: Male Social History Narrative  ** Merged History Encounter  **    [5] AllergiesAllergen Reactions  Honey Anaphylaxis  Mango Anaphylaxis  Sunflower Oil Anaphylaxis

## 2024-01-21 NOTE — Addendum Note (Signed)
 Addended by: NICHOLAUS ERMINIO BECKER on: 01/21/2024 08:31 PM Modules accepted: Orders

## 2024-01-22 ENCOUNTER — Ambulatory Visit: Payer: PRIVATE HEALTH INSURANCE | Admitting: Primary Care

## 2024-01-22 ENCOUNTER — Encounter: Payer: Self-pay | Admitting: Primary Care

## 2024-01-22 ENCOUNTER — Other Ambulatory Visit: Payer: Self-pay

## 2024-01-22 VITALS — BP 136/82 | HR 80 | Temp 98.1°F | Resp 18 | Ht 65.25 in | Wt 204.3 lb

## 2024-01-22 DIAGNOSIS — R7303 Prediabetes: Secondary | ICD-10-CM

## 2024-01-22 DIAGNOSIS — Z0000 Encounter for general adult medical examination without abnormal findings: Secondary | ICD-10-CM

## 2024-01-22 DIAGNOSIS — E785 Hyperlipidemia, unspecified: Secondary | ICD-10-CM

## 2024-01-22 DIAGNOSIS — Z1379 Encounter for other screening for genetic and chromosomal anomalies: Secondary | ICD-10-CM

## 2024-01-22 DIAGNOSIS — Z23 Encounter for immunization: Secondary | ICD-10-CM

## 2024-01-22 DIAGNOSIS — R7989 Other specified abnormal findings of blood chemistry: Secondary | ICD-10-CM

## 2024-01-22 DIAGNOSIS — Z91018 Allergy to other foods: Secondary | ICD-10-CM

## 2024-01-22 LAB — MEASLES IGG AB: Measles IgG: 6.1

## 2024-01-22 NOTE — Patient Instructions (Addendum)
 Get fasting labs done, still drink lots of water prior to lab test. Schedule bone density test at [facility]: [phone] testing is most useful for rare, early-onset dementias with strong family patterns. You can schedule an appointment with Genetics for this. [phone] to Allergy done, you should be contacted.  Stick to low salt, fiber rich diet.

## 2024-01-24 ENCOUNTER — Encounter: Payer: Self-pay | Admitting: Internal Medicine

## 2024-01-24 ENCOUNTER — Ambulatory Visit: Payer: PRIVATE HEALTH INSURANCE | Admitting: Internal Medicine

## 2024-01-24 ENCOUNTER — Other Ambulatory Visit: Payer: Self-pay

## 2024-01-24 VITALS — BP 140/80 | HR 77 | Ht 65.25 in | Wt 205.0 lb

## 2024-01-24 DIAGNOSIS — R0609 Other forms of dyspnea: Secondary | ICD-10-CM

## 2024-01-24 DIAGNOSIS — R072 Precordial pain: Secondary | ICD-10-CM

## 2024-01-24 NOTE — Progress Notes (Signed)
 CARDIOLOGY CONSULT Dear Nicholaus, Erminio CROME, MD,I had the pleasure of seeing your patient, Pamela Mccoy, in cardiology consultation on 01/24/2024 for evaluation of chest discomfort and dyspnea.History of Present IllnessThe patient is a 69 year old female who presents for chest discomfort, hiatal hernia, prediabetes, and elevated blood pressure.Approximately 2 weeks ago, she began experiencing chest discomfort.  No preceding events such as COVID-19 or respiratory infections. The discomfort is positional, worsening at night but persisting throughout the day. It is not necessarily worse with physical activity, although she feels it when going up and down stairs. Tenderness is present in the area of discomfort. This is her first experience with these symptoms. She recalls wearing a heart rate monitor in her 20s due to stress-related issues but does not believe her current symptoms are stress-related as they are more painful and not associated with tachycardia.She has a family history of heart disease in two brothers, one of whom died of a heart attack in his 59s. Both brothers had a history of drug use. She has been managing the discomfort with Tums, which effectively alleviates her GERD symptoms, and ibuprofen, which provides some relief.She has a known hiatal hernia, diagnosed via chest x-ray during a bout of pneumonia 2 years ago, and manages it through dietary control. Despite this, the pain persists.She underwent non-fasting blood work a few days ago and was advised to have her cholesterol levels rechecked. She reports not drinking enough water. Her blood pressure typically ranges from 120 to 130/82.SOCIAL HISTORYOccupations: Military serviceExercise: Walking most days of the week PAST MEDICAL HISTORY:Past Medical History[1]Past Surgical History[2]FAMILY HISTORYFamily History[3]SOCIAL HISTORYSocial History[4]Complete 12 point ROS reviewed  and negative except as stated above. ALLERGIES:Allergies[5]CURRENT MEDICATIONS:Current Outpatient Medications Medication Sig Dispense Refill  omeprazole  (PRILOSEC) 40 mg capsule Take 1 capsule (40 mg total) by mouth daily. 30 capsule 1  terbinafine  (LAMISIL ) 250 mg tablet Take 1 tablet (250 mg total) by mouth daily for Fungal Toenail Disease. 90 tablet 0  meloxicam  15 mg tablet Take 1 tablet (15 mg total) by mouth daily. (Patient not taking: Reported on 01/24/2024)   No current facility-administered medications for this visit. VITALS: BP 140/80   Pulse 77   Ht 1.657 m (5' 5.25)   Wt 93 kg (205 lb)   SpO2 99%   BMI 33.85 kg/m BP Readings from Last 3 Encounters: 01/24/24 140/80 01/22/24 132/80 01/16/24 118/82 Pulse Readings from Last 3 Encounters:01/24/24 : 7699/07/11 : 8012/31/25 : 82Wt Readings from Last 3 Encounters: 01/24/24 93 kg (205 lb) 01/22/24 92.7 kg (204 lb 4.8 oz) 01/16/24 93.4 kg (205 lb 12.8 oz) PHYSICAL EXAM:CONSTITUTIONAL: Pleasant and interactive.NEURO: Alert and oriented in no acute distress.HENT: MMM, oropharynx clear, anicteric sclerae.LYMPH: Supple without lymphadenopathy.NECK VASCULAR: JVP is approximately 5 cm above the right atrium.  There are no carotid bruits appreciated bilaterally.CARDIOVASCULAR: A comprehensive cardiovascular exam was performed with details as follows: Regular rhythm, normal S1-S2, no S3 or S4, there are no murmurs or rubs appreciated. PMI is normal in position in character.  No lower extremity edema noted.PULM:  Breathing comfortably on room air. Lungs are clear to auscultation bilaterally without wheezes, rales, or rhonchi.GI: Soft, nontender, nondistended.Lab Results Component Value Date/Time  CHOL 227 (!) 10/26/2023 08:08 AM  CHOL 212 (!) 09/29/2020 11:20 AM  HDL 43 10/26/2023 08:08 AM  HDL 36 (L) 09/29/2020 11:20 AM  LDLC 170 (!) 10/26/2023 08:08 AM  LDLC 158 (!)  09/29/2020 11:20 AM Lab Results Component Value Date  NA 143 01/16/2024  NA 142 10/26/2023  K 4.4 01/16/2024  K 4.4 10/26/2023  CL 104 01/16/2024  CL 107 10/26/2023  CO2 18 (L) 01/16/2024  CO2 22 10/26/2023  UN 12 01/16/2024  UN 9 10/26/2023  CREAT 1.06 (H) 01/16/2024  CREAT 0.87 10/26/2023  GLU 77 01/16/2024  GLU 85 10/26/2023  AST 20 01/16/2024  AST 22 10/26/2023  ALT 17 01/16/2024  ALT 17 10/26/2023 EKG on 01/16/2024 at PMD personally reviewed and shows sinus rhythm with borderline left atrial enlargement.Abdomen 2 viewResult Date: 12/31/202512/31/2025 1:19 PM ABDOMEN RADIOGRAPH CLINICAL INFORMATION: abd discomfort, R10.9-Unspecified abdominal pain. COMPARISON: None. PROCEDURE: Two projections of the abdomen were obtained. FINDINGS: The bowel gas pattern is unremarkable on this single AP view. There is a mild to moderate amount of air and stool projecting over the expected location of the colon and rectum. Unremarkable bowel gas pattern without evidence of obstruction. Mild to moderate stool burden in the colon. END OF IMPRESSION *Chest standard frontal and lateral viewsResult Date: 12/31/202512/31/2025 1:19 PM CHEST X-RAYS CLINICAL INFORMATION: chest pain, R07.89-Other chest pain. COMPARISON: 12/01/2021 PROCEDURE: Frontal and lateral  projections of the chest were obtained. FINDINGS: Tubes and Catheters: None. Lungs: The lungs are clear. Pleura/Pleural space: No pleural effusion is present. No pneumothorax. Heart and Mediastinum: Normal. Moderate-sized hiatal hernia. Additional Findings: No acute or aggressive bone abnormality. No acute findings. Hiatal hernia. END OF IMPRESSION ---------------------------------------------------------------------------ASSESSMENT:1. Precordial chest pain  2. DOE (dyspnea on exertion)  RECOMMENDATIONS:69 year old female who this has been experiencing chest discomfort and dyspnea over  the last 2 weeks.  2 components to her chest discomfort, 1 description appears to be GERD/moderate hiatal hernia and has improved with omeprazole  and Tums.  Second description appears musculoskeletal, chest is tender to palpation and has been improving with ibuprofen.  Symptoms are not suspicious for angina.  She has unremarkable physical exam and EKG.Although her symptoms are not typical, she has risk factors for CAD including female gender age greater than 32, family history of CAD.  She also has not seen a PMD in many years and was being worked up for prediabetes, elevated blood pressure, dyslipidemia.  Will schedule her for stress echo.  If the study is unremarkable, we discussed continued lifestyle modifications and cardiovascular risk reduction.I will see her in follow-up after the stress echo.As always, please do not hesitate if questions or issues arise.  I hope this has been helpful to you and I look forward to continuing to work with you in the future.  Sincerely,Myrakle Wingler CHRISTINE Keaton Beichner, MD [1] Past Medical History:Diagnosis Date  Hepatitis B   Hyperlipidemia  [2] Past Surgical History:Procedure Laterality Date  CESAREAN SECTION, CLASSIC    x 1  CESAREAN SECTION, CLASSIC    Cesarean Section Conversion Data  [3] Family HistoryProblem Relation Name Age of Onset  Cancer Mother    Cancer Father    Coronary artery disease Brother Richar   Coronary artery disease Brother Elsie   Kidney Disease Brother Elsie   Breast cancer Maternal Aunt   [4] Social HistorySocioeconomic History  Marital status: Widowed Tobacco Use  Smoking status: Never  Smokeless tobacco: Never Substance and Sexual Activity  Alcohol use: Yes   Comment: 1/week  Drug use: No  Sexual activity: Never   Partners: Male Social History Narrative  ** Merged History Encounter **    [5] AllergiesAllergen Reactions  Honey Anaphylaxis   Mango Anaphylaxis  Sunflower Oil Anaphylaxis

## 2024-01-28 ENCOUNTER — Telehealth: Payer: Self-pay | Admitting: Allergy and Immunology

## 2024-01-28 NOTE — Telephone Encounter (Unsigned)
 Copied from CRM #[id number]. Topic: Appointments - Schedule Appointment>> Jan 28, 2024  1:30 PM Heather ORN wrote:Patient has been scheduled for NPV/Skin test [place]Provider:BingemannWas the referral assigned? yesIf the patient has a VA referral was is assigned? yesDoes the patient need a medication hold list sent to them? yesHow would patient prefer their medication hold list sent? Mychart

## 2024-01-31 ENCOUNTER — Telehealth: Payer: Self-pay

## 2024-01-31 NOTE — Telephone Encounter (Signed)
 Check out comments: Stress echo and f/up next available Attempted to call patient to schedule- she said she will call office back to schedule this.

## 2024-02-15 ENCOUNTER — Other Ambulatory Visit: Payer: Self-pay

## 2024-02-15 ENCOUNTER — Ambulatory Visit: Payer: PRIVATE HEALTH INSURANCE | Admitting: Foot & Ankle Surgery

## 2024-02-15 VITALS — Ht 65.0 in | Wt 205.0 lb

## 2024-02-15 DIAGNOSIS — B181 Chronic viral hepatitis B without delta-agent: Secondary | ICD-10-CM

## 2024-02-15 DIAGNOSIS — R7303 Prediabetes: Secondary | ICD-10-CM

## 2024-02-15 DIAGNOSIS — B351 Tinea unguium: Secondary | ICD-10-CM

## 2024-02-15 MED ORDER — CICLOPIROX 0.77 % EX GEL: Freq: Two times a day (BID) | CUTANEOUS | 0 refills | Status: AC

## 2024-02-15 NOTE — Progress Notes (Signed)
 SUBJECTIVE:   Chief Complaint Patient presents with  Right Foot - New Patient Visit   Pt states bilateral toenails discoloration, thickening, crumbling. Onset proabably 20 years ago. Has been using lamosil and OTC medications with middling relief.   Left Foot - New Patient Visit 69 year old patient presents to clinic to establish care.  She has a history of bilateral toenail discoloration and thickening along with crumbling for about 20 years.  She recently was given over-the-counter options as well as Lamisil .  She is almost completed her Lamisil  course.  She feels like the bottoms of the nails are getting better.  She presents today to discuss other treatment options that may be possible.MEDICATIONS:   Current Outpatient Medications Medication  omeprazole  (PRILOSEC) 40 mg capsule  Ciclopirox  (LOPROX ) 0.77 % gel  meloxicam  15 mg tablet No current facility-administered medications for this visit. ALLERGIES:   Allergies[1]PAST MEDICAL HISTORY:   Past Medical History[2]PAST SURGICAL HISTORY:   Past Surgical History[3]FAMILY HISTORY:   Family History[4]SOCIAL HISTORY:   Social History[5]OBJECTIVE:   Constitution: Well-developed and well-nourished. Gait appears normal.Sensorium: Grossly intact to sharp/dull.  Vibratory:  Intact.Vascular examination: Dorsalis pedis and posterior tibial pulses are strong bilaterally with capillary filling time less than 3 seconds to all digits.  There is no peripheral edema. There are no varicosities.Integument: Skin appears moist, warm, and supple with positive hair growth.  There are no color changes.  Nails 1-5 bilaterally are thickened, dystrophic with subungual debris.  The distal aspect of the nail is more thick than the proximal aspect.  Proximal aspect appears healthy in nature.   Musculoskeletal: Joints are congruous, equal and pain-free.  Muscle strength is 5/5, supinators and pronators.  Arch height appears normal.     ASSESSMENTEncounter Diagnoses Name Primary?  Onychomycosis Yes  Prediabetes   Chronic hepatitis B virus infection  PLANPatient seen and evaluatedI personally reviewed PCP note in detailReviewed LFTs in great detail and they were within normal limits in December 2025Hepatitis panel remains positivePCP has given her 90 days of medication for the Lamisil .  I would not recommend a refill at this point in time as it seems her symptoms are improvingExplained to the patient that it will take several months for her to notice significant improvements and encourage patient to document with photographic evidence starting today and then in 3 months from now Discussed chance for failure of treatment and recurrence of fungal nails.Awaiting repeat LFTs from PCP perspectivePrescription for ciclopirox  provided for the distal aspect of the nails.  Patient can also place this on plantar feet the wound there are no signs of tinea pedisIncreased risk of recurrence and this can be mitigated with regular washing of socks and Lysol of shoes as well as Clorox or Lysol in her bathrooms.  No barefoot walking outside her home especially in pools or gymsFollow up in 6 months or as needed [1] AllergiesAllergen Reactions  Honey Anaphylaxis  Mango Anaphylaxis  Sunflower Oil Anaphylaxis [2] Past Medical History:Diagnosis Date  Hepatitis B   Hyperlipidemia  [3] Past Surgical History:Procedure Laterality Date  CESAREAN SECTION, CLASSIC    x 1  CESAREAN SECTION, CLASSIC    Cesarean Section Conversion Data  [4] Family HistoryProblem Relation Name Age of Onset  Cancer Mother    Cancer Father    Coronary artery disease Brother Richar   Coronary artery disease Brother Elsie   Kidney Disease Brother Elsie   Breast cancer Maternal Aunt   [5] Social HistoryTobacco Use  Smoking status: Never  Smokeless tobacco: Never Substance Use  Topics   Alcohol use: Yes   Comment: 1/week  Drug use: No

## 2024-02-18 ENCOUNTER — Other Ambulatory Visit: Payer: Self-pay | Admitting: Primary Care

## 2024-02-18 MED ORDER — OMEPRAZOLE 40 MG PO CPDR *I*: 40.0000 mg | DELAYED_RELEASE_CAPSULE | Freq: Every day | ORAL | 3 refills | Status: AC

## 2024-02-19 ENCOUNTER — Other Ambulatory Visit
Admission: RE | Admit: 2024-02-19 | Discharge: 2024-02-19 | Disposition: A | Payer: PRIVATE HEALTH INSURANCE | Source: Ambulatory Visit

## 2024-02-19 DIAGNOSIS — Z0000 Encounter for general adult medical examination without abnormal findings: Secondary | ICD-10-CM

## 2024-02-19 DIAGNOSIS — R7989 Other specified abnormal findings of blood chemistry: Secondary | ICD-10-CM

## 2024-02-19 DIAGNOSIS — R7303 Prediabetes: Secondary | ICD-10-CM

## 2024-02-19 LAB — COMPREHENSIVE METABOLIC PANEL
ALT: 16 U/L (ref 0–35)
AST: 20 U/L (ref 0–35)
Albumin: 3.9 g/dL (ref 3.5–5.2)
Alk Phos: 64 U/L (ref 35–105)
Anion Gap: 11 (ref 7–16)
Bilirubin,Total: 0.4 mg/dL (ref 0.0–1.2)
CO2: 22 mmol/L (ref 20–28)
Calcium: 9 mg/dL (ref 8.6–10.2)
Chloride: 106 mmol/L (ref 96–108)
Creatinine: 0.88 mg/dL (ref 0.51–0.95)
Glucose: 94 mg/dL (ref 60–99)
Lab: 12 mg/dL (ref 6–20)
Potassium: 4.3 mmol/L (ref 3.3–5.1)
Sodium: 139 mmol/L (ref 133–145)
Total Protein: 7.1 g/dL (ref 6.3–7.7)
eGFR BY CREAT: 72

## 2024-02-19 LAB — MICROALBUMIN, URINE, RANDOM
Creatinine,UR: 36 mg/dL (ref 20–300)
Microalbumin,UR: <1.2 mg/dL

## 2024-02-19 LAB — LIPID PANEL
Chol/HDL Ratio: 4.7
Cholesterol: 203 mg/dL — AB
HDL: 43 mg/dL (ref 40–60)
LDL Calculated: 146 mg/dL — AB
Non HDL Cholesterol: 160 mg/dL
Triglycerides: 79 mg/dL

## 2024-02-19 LAB — PROTEIN,UR + CREAT,UR WITH RATIO
Creatinine,UR: 36 mg/dL (ref 20–300)
Protein,UR: 8 mg/dL (ref 0–11)
TP Creatinine ratio,UR: 0.22

## 2024-02-19 LAB — CYSTATIN C WITH EGFR
Cystatin C: 0.9 mg/L (ref 0.6–1.0)
eGFR by Cystatin C: 81

## 2024-02-19 LAB — VITAMIN D: 25-OH Vit D Total: 28 ng/mL — ABNORMAL LOW (ref 30–60)

## 2024-02-20 LAB — HEMOGLOBIN A1C: Hemoglobin A1C: 6.3 % — ABNORMAL HIGH (ref <=5.6)

## 2024-02-27 ENCOUNTER — Other Ambulatory Visit: Payer: PRIVATE HEALTH INSURANCE | Admitting: Cardiology

## 2024-03-21 ENCOUNTER — Ambulatory Visit: Payer: PRIVATE HEALTH INSURANCE | Admitting: Internal Medicine

## 2024-05-16 ENCOUNTER — Ambulatory Visit: Payer: PRIVATE HEALTH INSURANCE | Admitting: Foot & Ankle Surgery

## 2024-06-04 ENCOUNTER — Ambulatory Visit: Payer: PRIVATE HEALTH INSURANCE | Admitting: Allergy and Immunology
